# Patient Record
Sex: Female | Born: 1960
Health system: Southern US, Community
[De-identification: ages and names within clinical notes are randomized; demographics above are authoritative.]

## PROBLEM LIST (undated history)

## (undated) DIAGNOSIS — F32A Depression, unspecified: Secondary | ICD-10-CM

## (undated) DIAGNOSIS — K219 Gastro-esophageal reflux disease without esophagitis: Secondary | ICD-10-CM

## (undated) DIAGNOSIS — I499 Cardiac arrhythmia, unspecified: Secondary | ICD-10-CM

## (undated) DIAGNOSIS — F329 Major depressive disorder, single episode, unspecified: Secondary | ICD-10-CM

## (undated) DIAGNOSIS — W503XXA Accidental bite by another person, initial encounter: Secondary | ICD-10-CM

## (undated) DIAGNOSIS — E785 Hyperlipidemia, unspecified: Secondary | ICD-10-CM

## (undated) DIAGNOSIS — R42 Dizziness and giddiness: Secondary | ICD-10-CM

## (undated) DIAGNOSIS — R002 Palpitations: Secondary | ICD-10-CM

## (undated) DIAGNOSIS — D259 Leiomyoma of uterus, unspecified: Secondary | ICD-10-CM

## (undated) DIAGNOSIS — M1990 Unspecified osteoarthritis, unspecified site: Secondary | ICD-10-CM

## (undated) DIAGNOSIS — M419 Scoliosis, unspecified: Secondary | ICD-10-CM

## (undated) DIAGNOSIS — F419 Anxiety disorder, unspecified: Secondary | ICD-10-CM

## (undated) HISTORY — DX: Gastro-esophageal reflux disease without esophagitis: K21.9

## (undated) HISTORY — DX: Hyperlipidemia, unspecified: E78.5

---

## 1983-10-12 HISTORY — PX: BREAST EXCISIONAL BIOPSY: SUR124

## 1983-10-12 HISTORY — PX: BREAST BIOPSY: SHX20

## 1993-10-11 HISTORY — PX: UTERINE FIBROID SURGERY: SHX826

## 2005-01-18 ENCOUNTER — Ambulatory Visit: Payer: Self-pay | Admitting: Obstetrics and Gynecology

## 2006-02-14 ENCOUNTER — Ambulatory Visit: Payer: Self-pay | Admitting: Obstetrics and Gynecology

## 2007-02-20 ENCOUNTER — Ambulatory Visit: Payer: Self-pay | Admitting: Obstetrics and Gynecology

## 2007-03-08 ENCOUNTER — Ambulatory Visit: Payer: Self-pay | Admitting: Obstetrics and Gynecology

## 2007-07-08 ENCOUNTER — Emergency Department: Payer: Self-pay | Admitting: Emergency Medicine

## 2007-07-25 ENCOUNTER — Ambulatory Visit: Payer: Self-pay | Admitting: Gastroenterology

## 2008-02-26 ENCOUNTER — Ambulatory Visit: Payer: Self-pay | Admitting: Obstetrics and Gynecology

## 2008-10-11 HISTORY — PX: ESOPHAGOGASTRODUODENOSCOPY (EGD) WITH ESOPHAGEAL DILATION: SHX5812

## 2009-03-03 ENCOUNTER — Ambulatory Visit: Payer: Self-pay | Admitting: Obstetrics and Gynecology

## 2009-05-05 ENCOUNTER — Emergency Department: Payer: Self-pay

## 2010-03-05 ENCOUNTER — Ambulatory Visit: Payer: Self-pay | Admitting: Obstetrics and Gynecology

## 2010-08-01 ENCOUNTER — Emergency Department: Payer: Self-pay | Admitting: Internal Medicine

## 2011-03-10 ENCOUNTER — Ambulatory Visit: Payer: Self-pay | Admitting: Obstetrics and Gynecology

## 2011-03-15 ENCOUNTER — Ambulatory Visit: Payer: Self-pay | Admitting: Obstetrics and Gynecology

## 2012-03-31 ENCOUNTER — Ambulatory Visit: Payer: Self-pay | Admitting: Obstetrics and Gynecology

## 2013-04-06 ENCOUNTER — Ambulatory Visit: Payer: Self-pay | Admitting: Obstetrics and Gynecology

## 2014-02-21 DIAGNOSIS — F419 Anxiety disorder, unspecified: Secondary | ICD-10-CM | POA: Insufficient documentation

## 2014-02-21 DIAGNOSIS — K219 Gastro-esophageal reflux disease without esophagitis: Secondary | ICD-10-CM | POA: Insufficient documentation

## 2014-04-18 ENCOUNTER — Ambulatory Visit: Payer: Self-pay | Admitting: Obstetrics and Gynecology

## 2014-12-05 DIAGNOSIS — R0602 Shortness of breath: Secondary | ICD-10-CM | POA: Insufficient documentation

## 2014-12-05 DIAGNOSIS — R002 Palpitations: Secondary | ICD-10-CM | POA: Insufficient documentation

## 2014-12-10 DIAGNOSIS — E782 Mixed hyperlipidemia: Secondary | ICD-10-CM | POA: Insufficient documentation

## 2015-04-01 ENCOUNTER — Other Ambulatory Visit: Payer: Self-pay | Admitting: Obstetrics and Gynecology

## 2015-04-01 ENCOUNTER — Other Ambulatory Visit: Payer: Self-pay | Admitting: Pharmacy Technician

## 2015-04-01 DIAGNOSIS — Z1231 Encounter for screening mammogram for malignant neoplasm of breast: Secondary | ICD-10-CM

## 2015-06-23 ENCOUNTER — Ambulatory Visit
Admission: RE | Admit: 2015-06-23 | Discharge: 2015-06-23 | Disposition: A | Discharge status: Home / Self Care | Payer: 59 | Source: Ambulatory Visit | Attending: Obstetrics and Gynecology | Admitting: Obstetrics and Gynecology

## 2015-06-23 DIAGNOSIS — Z1231 Encounter for screening mammogram for malignant neoplasm of breast: Secondary | ICD-10-CM | POA: Insufficient documentation

## 2016-01-08 ENCOUNTER — Telehealth: Payer: Self-pay | Admitting: Gastroenterology

## 2016-01-08 NOTE — Telephone Encounter (Signed)
Left voice message for patient to call and schedule a appointment for reflux disease,notes under media

## 2016-02-09 ENCOUNTER — Ambulatory Visit (INDEPENDENT_AMBULATORY_CARE_PROVIDER_SITE_OTHER): Payer: 59 | Admitting: Gastroenterology

## 2016-02-09 ENCOUNTER — Encounter: Payer: Self-pay | Admitting: Gastroenterology

## 2016-02-09 ENCOUNTER — Other Ambulatory Visit: Payer: Self-pay

## 2016-02-09 VITALS — BP 114/63 | HR 77 | Temp 98.5°F | Ht 63.0 in | Wt 145.0 lb

## 2016-02-09 DIAGNOSIS — K219 Gastro-esophageal reflux disease without esophagitis: Secondary | ICD-10-CM | POA: Diagnosis not present

## 2016-02-09 MED ORDER — PANTOPRAZOLE SODIUM 40 MG PO TBEC: 40.0000 mg | DELAYED_RELEASE_TABLET | Freq: Every day | ORAL | Status: DC

## 2016-02-09 NOTE — Progress Notes (Signed)
Gastroenterology Consultation  Referring Provider:     No ref. provider found Primary Care Physician:  Pauline Good, NP Primary Gastroenterologist:  Dr. Allen Norris     Reason for Consultation:     Heartburn        HPI:   Carol Espinoza is a 55 y.o. y/o female referred for consultation & management of Heartburn by Dr. Pauline Good, NP.   This patient comes to me today with  of heartburn. The patient had seen me many years ago and was put on  Protonix at that time but reports that her insurance company did not cover it. She was switched to omeprazole. The patient was later put on Celexa. She reports that she started to have palpitations and she found out that there was a drug to drug interaction. She stopped the omeprazole and her symptoms of palpitations went away. She was put on Zantac and reports that her symptoms improved but did not go away. She denies any dysphagia. She also denies ever having a colonoscopy in the past. There is no report of rectal bleeding, nausea, vomiting, black stools or bloody stools.  Past Medical History  Diagnosis Date  . GERD (gastroesophageal reflux disease)   . Hyperlipidemia     Past Surgical History  Procedure Laterality Date  . Breast biopsy Right 1985    removed third nipple    Prior to Admission medications   Medication Sig Start Date End Date Taking? Authorizing Provider  citalopram (CELEXA) 40 MG tablet  02/03/16  Yes Historical Provider, MD  JUNEL FE 1/20 1-20 MG-MCG tablet  02/05/16  Yes Historical Provider, MD  simvastatin (ZOCOR) 40 MG tablet TAKE 1 TABLET (40 MG TOTAL) BY MOUTH NIGHTLY. 01/08/16  Yes Historical Provider, MD  omeprazole (PRILOSEC) 20 MG capsule Reported on 02/09/2016 11/10/15   Historical Provider, MD  pantoprazole (PROTONIX) 40 MG tablet Take 1 tablet (40 mg total) by mouth daily. 02/09/16   Lucilla Lame, MD  ranitidine (ZANTAC) 150 MG capsule Reported on 02/09/2016 02/07/16   Historical Provider, MD    History reviewed. No pertinent  family history.   Social History  Substance Use Topics  . Smoking status: Never Smoker   . Smokeless tobacco: Never Used  . Alcohol Use: No    Allergies as of 02/09/2016 - Review Complete 02/09/2016  Allergen Reaction Noted  . Sulfamethoxazole-trimethoprim Rash 02/09/2016    Review of Systems:    All systems reviewed and negative except where noted in HPI.   Physical Exam:  BP 114/63 mmHg  Pulse 77  Temp(Src) 98.5 F (36.9 C) (Oral)  Ht 5\' 3"  (1.6 m)  Wt 145 lb (65.772 kg)  BMI 25.69 kg/m2 No LMP recorded. Psych:  Alert and cooperative. Normal mood and affect. General:   Alert,  Well-developed, well-nourished, pleasant and cooperative in NAD Head:  Normocephalic and atraumatic. Eyes:  Sclera clear, no icterus.   Conjunctiva pink. Ears:  Normal auditory acuity. Nose:  No deformity, discharge, or lesions. Mouth:  No deformity or lesions,oropharynx pink & moist. Neck:  Supple; no masses or thyromegaly. Lungs:  Respirations even and unlabored.  Clear throughout to auscultation.   No wheezes, crackles, or rhonchi. No acute distress. Heart:  Regular rate and rhythm; no murmurs, clicks, rubs, or gallops. Abdomen:  Normal bowel sounds.  No bruits.  Soft, non-tender and non-distended without masses, hepatosplenomegaly or hernias noted.  No guarding or rebound tenderness.  Negative Carnett sign.   Rectal:  Deferred.  Msk:  Symmetrical  without gross deformities.  Good, equal movement & strength bilaterally. Pulses:  Normal pulses noted. Extremities:  No clubbing or edema.  No cyanosis. Neurologic:  Alert and oriented x3;  grossly normal neurologically. Skin:  Intact without significant lesions or rashes.  No jaundice. Lymph Nodes:  No significant cervical adenopathy. Psych:  Alert and cooperative. Normal mood and affect.  Imaging Studies: No results found.  Assessment and Plan:   Carol Espinoza is a 55 y.o. y/o female  who has a history of heartburn and has needed a dilation  of her esophagus in the past. She was well-controlled on omeprazole but had to stop it because of a drug to drug interaction. She will be started back on pantoprazole. The patient has also been encouraged to have a colonoscopy. The patient states she will consider it.   Note: This dictation was prepared with Dragon dictation along with smaller phrase technology. Any transcriptional errors that result from this process are unintentional.

## 2016-04-06 ENCOUNTER — Other Ambulatory Visit: Payer: Self-pay | Admitting: Obstetrics and Gynecology

## 2016-04-06 DIAGNOSIS — Z1231 Encounter for screening mammogram for malignant neoplasm of breast: Secondary | ICD-10-CM

## 2016-06-16 ENCOUNTER — Other Ambulatory Visit: Payer: Self-pay

## 2016-06-16 DIAGNOSIS — K2100 Gastro-esophageal reflux disease with esophagitis, without bleeding: Secondary | ICD-10-CM

## 2016-06-16 MED ORDER — PANTOPRAZOLE SODIUM 40 MG PO TBEC: 40.0000 mg | DELAYED_RELEASE_TABLET | Freq: Every day | ORAL | 3 refills | Status: DC

## 2016-06-24 ENCOUNTER — Ambulatory Visit
Admission: RE | Admit: 2016-06-24 | Discharge: 2016-06-24 | Disposition: A | Discharge status: Home / Self Care | Payer: 59 | Source: Ambulatory Visit | Attending: Obstetrics and Gynecology | Admitting: Obstetrics and Gynecology

## 2016-06-24 ENCOUNTER — Other Ambulatory Visit: Payer: Self-pay | Admitting: Obstetrics and Gynecology

## 2016-06-24 DIAGNOSIS — Z1231 Encounter for screening mammogram for malignant neoplasm of breast: Secondary | ICD-10-CM | POA: Diagnosis not present

## 2016-09-09 ENCOUNTER — Telehealth: Payer: Self-pay | Admitting: Gastroenterology

## 2016-09-09 NOTE — Telephone Encounter (Signed)
colonoscopy

## 2016-09-20 NOTE — Telephone Encounter (Signed)
LVM for pt to return my call to schedule a colonoscopy on 09/17/16 @ 2:38pm.

## 2016-09-22 ENCOUNTER — Other Ambulatory Visit: Payer: Self-pay

## 2016-09-22 ENCOUNTER — Telehealth: Payer: Self-pay

## 2016-09-22 NOTE — Telephone Encounter (Signed)
Wants to be scheduled for a colonoscopy

## 2016-09-23 ENCOUNTER — Other Ambulatory Visit: Payer: Self-pay

## 2016-09-23 NOTE — Telephone Encounter (Signed)
Pt scheduled for a screening colonoscopy at [facility] on 10/22/16 with Wohl. Please precert.

## 2016-09-24 NOTE — Telephone Encounter (Signed)
Patient has Garment/textile technologist. Pre cert is not required

## 2016-10-14 ENCOUNTER — Encounter: Payer: Self-pay | Admitting: *Deleted

## 2016-10-21 NOTE — Discharge Instructions (Signed)

## 2016-10-22 ENCOUNTER — Ambulatory Visit
Admission: RE | Admit: 2016-10-22 | Discharge: 2016-10-22 | Disposition: A | Discharge status: Home / Self Care | Payer: Managed Care, Other (non HMO) | Source: Ambulatory Visit | Attending: Gastroenterology | Admitting: Gastroenterology

## 2016-10-22 ENCOUNTER — Encounter
Admission: RE | Disposition: A | Discharge status: Home / Self Care | Payer: Self-pay | Source: Ambulatory Visit | Attending: Gastroenterology

## 2016-10-22 ENCOUNTER — Ambulatory Visit: Payer: Managed Care, Other (non HMO) | Admitting: Anesthesiology

## 2016-10-22 DIAGNOSIS — E785 Hyperlipidemia, unspecified: Secondary | ICD-10-CM | POA: Diagnosis not present

## 2016-10-22 DIAGNOSIS — K640 First degree hemorrhoids: Secondary | ICD-10-CM | POA: Diagnosis not present

## 2016-10-22 DIAGNOSIS — Z79899 Other long term (current) drug therapy: Secondary | ICD-10-CM | POA: Insufficient documentation

## 2016-10-22 DIAGNOSIS — M419 Scoliosis, unspecified: Secondary | ICD-10-CM | POA: Insufficient documentation

## 2016-10-22 DIAGNOSIS — Z1211 Encounter for screening for malignant neoplasm of colon: Secondary | ICD-10-CM | POA: Diagnosis not present

## 2016-10-22 DIAGNOSIS — K219 Gastro-esophageal reflux disease without esophagitis: Secondary | ICD-10-CM | POA: Diagnosis not present

## 2016-10-22 HISTORY — PX: COLONOSCOPY WITH PROPOFOL: SHX5780

## 2016-10-22 HISTORY — DX: Palpitations: R00.2

## 2016-10-22 HISTORY — DX: Scoliosis, unspecified: M41.9

## 2016-10-22 HISTORY — PX: POLYPECTOMY: SHX5525

## 2016-10-22 HISTORY — DX: Dizziness and giddiness: R42

## 2016-10-22 SURGERY — COLONOSCOPY WITH PROPOFOL
Anesthesia: Monitor Anesthesia Care | Wound class: Contaminated

## 2016-10-22 MED ORDER — LACTATED RINGERS IV SOLN
INTRAVENOUS | Status: DC
  Administered 2016-10-22: 08:00:00 via INTRAVENOUS

## 2016-10-22 MED ORDER — OXYCODONE HCL 5 MG PO TABS: 5.0000 mg | ORAL_TABLET | Freq: Once | ORAL | Status: DC | PRN

## 2016-10-22 MED ORDER — LIDOCAINE HCL (CARDIAC) 20 MG/ML IV SOLN
INTRAVENOUS | Status: DC | PRN
  Administered 2016-10-22: 40 mg via INTRAVENOUS

## 2016-10-22 MED ORDER — LACTATED RINGERS IV SOLN: INTRAVENOUS | Status: DC

## 2016-10-22 MED ORDER — PROPOFOL 10 MG/ML IV BOLUS
INTRAVENOUS | Status: DC | PRN
  Administered 2016-10-22 (×3): 50 mg via INTRAVENOUS
  Administered 2016-10-22: 100 mg via INTRAVENOUS
  Administered 2016-10-22 (×2): 50 mg via INTRAVENOUS

## 2016-10-22 MED ORDER — STERILE WATER FOR IRRIGATION IR SOLN
Status: DC | PRN
  Administered 2016-10-22: 09:00:00

## 2016-10-22 MED ORDER — OXYCODONE HCL 5 MG/5ML PO SOLN: 5.0000 mg | Freq: Once | ORAL | Status: DC | PRN

## 2016-10-22 SURGICAL SUPPLY — 23 items

## 2016-10-22 NOTE — Anesthesia Postprocedure Evaluation (Signed)
Anesthesia Post Note  Patient: Carol Espinoza  Procedure(s) Performed: Procedure(s) (LRB): COLONOSCOPY WITH PROPOFOL (N/A) POLYPECTOMY  Patient location during evaluation: PACU Anesthesia Type: MAC Level of consciousness: awake Pain management: pain level controlled Vital Signs Assessment: post-procedure vital signs reviewed and stable Respiratory status: spontaneous breathing Cardiovascular status: blood pressure returned to baseline Postop Assessment: no headache Anesthetic complications: no    Jaci Standard, III,  Nkenge Sonntag D

## 2016-10-22 NOTE — Anesthesia Procedure Notes (Signed)
Procedure Name: MAC Date/Time: 10/22/2016 9:18 AM Performed by: Janna Arch Pre-anesthesia Checklist: Patient identified, Emergency Drugs available, Suction available and Patient being monitored Patient Re-evaluated:Patient Re-evaluated prior to inductionOxygen Delivery Method: Nasal cannula

## 2016-10-22 NOTE — Op Note (Addendum)
[facility] Patient Name: Carol Espinoza Procedure Date: 10/22/2016 9:12 AM MRN: [id number] Account #: [id number] Date of Birth: December 14, 1960 Admit Type: Outpatient Age: 56 Room: [facility] OR ROOM 01 Gender: Female Note Status: Finalized Procedure:            Colonoscopy Indications:          Screening for colorectal malignant neoplasm Providers:            Lucilla Lame MD, MD Referring MD:         Markus Jarvis, MD (Referring MD) Medicines:            Propofol per Anesthesia Complications:        No immediate complications. Procedure:            Pre-Anesthesia Assessment:                       - Prior to the procedure, a History and Physical was                        performed, and patient medications and allergies were                        reviewed. The patient's tolerance of previous                        anesthesia was also reviewed. The risks and benefits of                        the procedure and the sedation options and risks were                        discussed with the patient. All questions were                        answered, and informed consent was obtained. Prior                        Anticoagulants: The patient has taken no previous                        anticoagulant or antiplatelet agents. ASA Grade                        Assessment: II - A patient with mild systemic disease.                        After reviewing the risks and benefits, the patient was                        deemed in satisfactory condition to undergo the                        procedure.                       After obtaining informed consent, the colonoscope was                        passed under direct vision. Throughout the procedure,  the patient's blood pressure, pulse, and oxygen                        saturations were monitored continuously. The Olympus CF                        H180AL colonoscope (S#: [id number]) was introduced  through                        the anus and advanced to the the cecum, identified by                        appendiceal orifice and ileocecal valve. The                        colonoscopy was performed without difficulty. The                        patient tolerated the procedure well. The quality of                        the bowel preparation was excellent. Findings:      The perianal and digital rectal examinations were normal.      A 4 mm polyp was found in the rectum. The polyp was sessile. The polyp       was removed with a cold snare. Resection and retrieval were complete.      Non-bleeding internal hemorrhoids were found during retroflexion. The       hemorrhoids were Grade I (internal hemorrhoids that do not prolapse). Impression:           - One 4 mm polyp in the rectum, removed with a cold                        snare. Resected and retrieved.                       - Non-bleeding internal hemorrhoids. Recommendation:       - Discharge patient to home.                       - Resume previous diet.                       - Continue present medications.                       - Await pathology results.                       - Repeat colonoscopy in 5 years if polyp adenoma and 10                        years if hyperplastic Procedure Code(s):    --- Professional ---                       [phone], Colonoscopy, flexible; with removal of tumor(s),                        polyp(s), or other lesion(s) by snare technique Diagnosis Code(s):    --- Professional ---  Z12.11, Encounter for screening for malignant neoplasm                        of colon                       K62.1, Rectal polyp CPT copyright 2016 American Medical Association. All rights reserved. The codes documented in this report are preliminary and upon coder review may  be revised to meet current compliance requirements. Lucilla Lame MD, MD 10/22/2016 9:36:19 AM This report has been signed  electronically. Number of Addenda: 0 Note Initiated On: 10/22/2016 9:12 AM Scope Withdrawal Time: 0 hours 7 minutes 7 seconds  Total Procedure Duration: 0 hours 10 minutes 43 seconds       [facility]

## 2016-10-22 NOTE — Transfer of Care (Signed)
Immediate Anesthesia Transfer of Care Note  Patient: Carol Espinoza  Procedure(s) Performed: Procedure(s): COLONOSCOPY WITH PROPOFOL (N/A) POLYPECTOMY  Patient Location: PACU  Anesthesia Type: MAC  Level of Consciousness: awake, alert  and patient cooperative  Airway and Oxygen Therapy: Patient Spontanous Breathing and Patient connected to supplemental oxygen  Post-op Assessment: Post-op Vital signs reviewed, Patient's Cardiovascular Status Stable, Respiratory Function Stable, Patent Airway and No signs of Nausea or vomiting  Post-op Vital Signs: Reviewed and stable  Complications: No apparent anesthesia complications

## 2016-10-22 NOTE — H&P (Signed)
  Carol Lame, MD [facility] [address] [place], [place] [address] Phone: [phone] Fax : [phone]  Primary Care Physician:  Pauline Good, NP Primary Gastroenterologist:  Dr. Allen Norris  Pre-Procedure History & Physical: HPI:  Carol Espinoza is a 56 y.o. female is here for a screening colonoscopy.   Past Medical History:  Diagnosis Date  . GERD (gastroesophageal reflux disease)   . Hyperlipidemia   . Palpitations   . Scoliosis   . Vertigo    positional. monthly    Past Surgical History:  Procedure Laterality Date  . BREAST BIOPSY Right 1985   removed third nipple  . CESAREAN SECTION  1996  . ESOPHAGOGASTRODUODENOSCOPY (EGD) WITH ESOPHAGEAL DILATION  2010  . [place]    Prior to Admission medications   Medication Sig Start Date End Date Taking? Authorizing Provider  citalopram (CELEXA) 40 MG tablet  02/03/16  Yes Historical Provider, MD  JUNEL FE 1/20 1-20 MG-MCG tablet  02/05/16  Yes Historical Provider, MD  Multiple Vitamin (MULTIVITAMIN) tablet Take 1 tablet by mouth daily.   Yes Historical Provider, MD  pantoprazole (PROTONIX) 40 MG tablet Take 1 tablet (40 mg total) by mouth daily. 06/16/16  Yes Bradrick Kamau Allen Norris, MD  simvastatin (ZOCOR) 40 MG tablet TAKE 1 TABLET (40 MG TOTAL) BY MOUTH NIGHTLY. 01/08/16  Yes Historical Provider, MD    Allergies as of 09/23/2016 - Review Complete 02/09/2016  Allergen Reaction Noted  . Sulfamethoxazole-trimethoprim Rash 02/09/2016    History reviewed. No pertinent family history.  Social History   Social History  . Marital status: Married    Spouse name: N/A  . Number of children: N/A  . Years of education: N/A   Occupational History  . Not on file.   Social History Main Topics  . Smoking status: Never Smoker  . Smokeless tobacco: Never Used  . Alcohol use No  . Drug use: No  . Sexual activity: Not on file   Other Topics Concern  . Not on file   Social History Narrative  . No narrative on file     Review of Systems: See HPI, otherwise negative ROS  Physical Exam: BP 108/70   Pulse (!) 117   Temp 97.5 F (36.4 C) (Tympanic)   Resp 16   Ht 5\' 3"  (1.6 m)   Wt 151 lb (68.5 kg)   LMP 09/30/2016 (Approximate) Comment: preg test negative  SpO2 99%   BMI 26.75 kg/m  General:   Alert,  pleasant and cooperative in NAD Head:  Normocephalic and atraumatic. Neck:  Supple; no masses or thyromegaly. Lungs:  Clear throughout to auscultation.    Heart:  Regular rate and rhythm. Abdomen:  Soft, nontender and nondistended. Normal bowel sounds, without guarding, and without rebound.   Neurologic:  Alert and  oriented x4;  grossly normal neurologically.  Impression/Plan: [name] is now here to undergo a screening colonoscopy.  Risks, benefits, and alternatives regarding colonoscopy have been reviewed with the patient.  Questions have been answered.  All parties agreeable.

## 2016-10-22 NOTE — Anesthesia Preprocedure Evaluation (Signed)
Anesthesia Evaluation  Patient identified by MRN, date of birth, ID band Patient awake    Reviewed: Allergy & Precautions, H&P , NPO status   Airway Mallampati: II  TM Distance: >3 FB Neck ROM: full    Dental no notable dental hx.    Pulmonary neg pulmonary ROS,    Pulmonary exam normal        Cardiovascular negative cardio ROS Normal cardiovascular exam     Neuro/Psych    GI/Hepatic Neg liver ROS,   Endo/Other  negative endocrine ROS  Renal/GU negative Renal ROS     Musculoskeletal   Abdominal   Peds  Hematology negative hematology ROS (+)   Anesthesia Other Findings   Reproductive/Obstetrics                             Anesthesia Physical Anesthesia Plan  ASA: II  Anesthesia Plan: MAC   Post-op Pain Management:    Induction:   Airway Management Planned:   Additional Equipment:   Intra-op Plan:   Post-operative Plan:   Informed Consent:   Plan Discussed with:   Anesthesia Plan Comments:         Anesthesia Quick Evaluation

## 2016-10-25 ENCOUNTER — Encounter: Payer: Self-pay | Admitting: Gastroenterology

## 2017-02-18 ENCOUNTER — Encounter: Payer: Self-pay | Admitting: *Deleted

## 2017-02-18 ENCOUNTER — Ambulatory Visit
Admission: EM | Admit: 2017-02-18 | Discharge: 2017-02-18 | Disposition: A | Discharge status: Home / Self Care | Payer: Managed Care, Other (non HMO) | Attending: Family Medicine | Admitting: Family Medicine

## 2017-02-18 DIAGNOSIS — H6123 Impacted cerumen, bilateral: Secondary | ICD-10-CM

## 2017-02-18 DIAGNOSIS — H9202 Otalgia, left ear: Secondary | ICD-10-CM | POA: Diagnosis not present

## 2017-02-18 NOTE — ED Triage Notes (Signed)
Patient started having dizziness, ear pain, and nasal congestion 1 week ago. Patient also reports TMJ symptoms.

## 2017-02-18 NOTE — ED Provider Notes (Signed)
[facility] ____________________________________________  Time seen: Approximately 1150 am  I have reviewed the triage vital signs and the nursing notes.   HISTORY  Chief Complaint Otalgia  HPI Carol Espinoza is a 56 y.o. female  presenting for evaluation of ear discomfort, runny nose and TMJ discomfort has been present for the last week. Patient reports that she does have a history and TMJ discomfort which is previously followed with Ear nose and throat. States that she has an Ear, Nose and throat appointment in 2 weeks regarding the same. Patient reports that she talks a lot, clenches her teeth and chews a lot of chewy foods which often aggravates the area. Patient reports that she's been having some discomfort to her left TMJ joint for the last week. REports some grinding at times to TMJ joints. Also reports some runny nose, states possible seasonal allergies, but denies any sinus pain or sinus pressure. Denies any thick nasal drainage. Denies sore throat. Denies fevers. Patient does also report that last week she had a few episodes of lightheadedness that was present only first thing in the morning when she got up and then quickly resolved when she got going. Patient states that she directly correlates this as she had felt some fluid in her ears at that time. Denies any lightheadedness currently. Denies any syncopal or near syncopal events. Denies any other accompanying factors. Reports otherwise feels well.  Patient does state that her left ear feels like it is clogged with mild discomfort. Denies vision changes. Denies chest pain, shortness of breath, abdominal pain, dysuria, extremity pain, extremity swelling or rash. Denies recent sickness. Denies recent antibiotic use.    Past Medical History:  Diagnosis Date  . GERD (gastroesophageal reflux disease)   . Hyperlipidemia   . Palpitations   . Scoliosis   . Vertigo    positional. monthly    Patient Active Problem List     Diagnosis Date Noted  . Special screening for malignant neoplasms, colon   . Mixed hyperlipidemia 12/10/2014  . Palpitations 12/05/2014  . Shortness of breath 12/05/2014  . Anxiety 02/21/2014  . GERD (gastroesophageal reflux disease) 02/21/2014    Past Surgical History:  Procedure Laterality Date  . BREAST BIOPSY Right 1985   removed third nipple  . CESAREAN SECTION  1996  . COLONOSCOPY WITH PROPOFOL N/A 10/22/2016   Procedure: COLONOSCOPY WITH PROPOFOL;  Surgeon: Lucilla Lame, MD;  Location: [facility];  Service: Endoscopy;  Laterality: N/A;  . ESOPHAGOGASTRODUODENOSCOPY (EGD) WITH ESOPHAGEAL DILATION  2010  . POLYPECTOMY  10/22/2016   Procedure: POLYPECTOMY;  Surgeon: Lucilla Lame, MD;  Location: [facility];  Service: Endoscopy;;  . UTERINE FIBROID SURGERY  1995     No current facility-administered medications for this encounter.   Current Outpatient Prescriptions:  .  citalopram (CELEXA) 40 MG tablet, , Disp: , Rfl:  .  JUNEL FE 1/20 1-20 MG-MCG tablet, , Disp: , Rfl:  .  Multiple Vitamin (MULTIVITAMIN) tablet, Take 1 tablet by mouth daily., Disp: , Rfl:  .  pantoprazole (PROTONIX) 40 MG tablet, Take 1 tablet (40 mg total) by mouth daily., Disp: 90 tablet, Rfl: 3 .  simvastatin (ZOCOR) 40 MG tablet, TAKE 1 TABLET (40 MG TOTAL) BY MOUTH NIGHTLY., Disp: , Rfl: 4  Allergies Amoxicillin; Other; and Sulfamethoxazole-trimethoprim  History reviewed. No pertinent family history.  Social History Social History  Substance Use Topics  . Smoking status: Never Smoker  . Smokeless tobacco: Never Used  . Alcohol use No  Review of Systems Constitutional: No fever/chills Eyes: No visual changes. ENT: No sore throat. As above. Cardiovascular: Denies chest pain. Respiratory: Denies shortness of breath. Gastrointestinal: No abdominal pain.   Genitourinary: Negative for dysuria. Musculoskeletal: Negative for back pain. Skin: Negative for  rash.   ____________________________________________   PHYSICAL EXAM:  VITAL SIGNS: ED Triage Vitals  Enc Vitals Group     BP 02/18/17 1144 (!) 146/62     Pulse Rate 02/18/17 1144 (!) 107 Recheck 88     Resp 02/18/17 1144 18     Temp 02/18/17 1144 98.2 F (36.8 C)     Temp Source 02/18/17 1144 Oral     SpO2 02/18/17 1144 100 %     Weight 02/18/17 1145 150 lb (68 kg)     Height 02/18/17 1145 5\' 2"  (1.575 m)     Head Circumference --      Peak Flow --      Pain Score 02/18/17 1145 0     Pain Loc --      Pain Edu? --      Excl. in [place]? --     Constitutional: Alert and oriented. Well appearing and in no acute distress. Head: Atraumatic. No sinus tenderness to palpation. No swelling. No erythema. Mild tenderness to bilateral, left greater than right TMJ joint.  Ears: Bilateral cerumen impaction. Bilateral ears nontender. After irrigation and curette used for cerumen removal, able to visualize partial left TM and does appear erythematous but unable to visualize fully, no erythema noted to right TM. No surrounding tenderness, swelling or erythema.  Nose:Nasal congestion with clear rhinorrhea  Mouth/Throat: Mucous membranes are moist. No pharyngeal erythema. No tonsillar swelling or exudate.  Hematological/Lymphatic/Immunilogical: No cervical lymphadenopathy. Cardiovascular: Normal rate, regular rhythm. Grossly normal heart sounds.  Good peripheral circulation. Respiratory: Normal respiratory effort.  No retractions. No wheezes, rales or rhonchi. Good air movement.  Musculoskeletal: Ambulatory with steady gait.  Neurologic:  Normal speech and language. No gait instability. Skin:  Skin appears warm, dry  Psychiatric: Mood and affect are normal. Speech and behavior are normal.  ___________________________________________   LABS (all labs ordered are listed, but only abnormal results are displayed)  Labs Reviewed - No data to  display ____________________________________________  RADIOLOGY  No results found. ____________________________________________   PROCEDURES Procedures   Performed by RN and myself. Patient gave verbal consent. Ceruminosis impaction noted bilaterally.  Wax is removed by irrigation and curette. Instructions for home care to prevent wax buildup are given.  INITIAL IMPRESSION / ASSESSMENT AND PLAN / ED COURSE  Pertinent labs & imaging results that were available during my care of the patient were reviewed by me and considered in my medical decision making (see chart for details).  Well-appearing patient. No acute distress. Bilateral cerumen impaction. Post irrigation and in no removal with curette, cerumen remains. Was able to visualize partial of the left TM appears erythematous but unable to fully visualize complete TM and counseled regarding this. Discussed with patient treating with oral amoxicillin as well as antihistamines, patient states that she does not want to take any antibiotics as she does not tolerate antibiotics well. Patient states that she would prefer to see your nose and throat prior to starting antibiotics. Counseled regarding calling and rescheduling her ear nose and throat appointment for sooner. Patient agrees to this. Patient also agrees to seek reevaluation for any change or increase discomfort. Discussed NSAID use and supportive care for TMJ tenderness. Patient verbalized understanding and agree to this plan.  Discussed follow up with Primary care physician this week. Discussed follow up and return parameters including no resolution or any worsening concerns. Patient verbalized understanding and agreed to plan.   ____________________________________________   FINAL CLINICAL IMPRESSION(S) / ED DIAGNOSES  Final diagnoses:  Discomfort of left ear  Bilateral impacted cerumen     Discharge Medication List as of 02/18/2017 12:30 PM      Note: This dictation was  prepared with Dragon dictation along with smaller phrase technology. Any transcriptional errors that result from this process are unintentional.         Marylene Land, NP 02/18/17 1301

## 2017-02-18 NOTE — Discharge Instructions (Signed)
Follow up closely with Ear Nose and throat.  Follow up with your primary care physician this week as needed. Return to Urgent care for new or worsening concerns.

## 2017-02-24 ENCOUNTER — Ambulatory Visit
Admission: EM | Admit: 2017-02-24 | Discharge: 2017-02-24 | Disposition: A | Discharge status: Home / Self Care | Payer: Managed Care, Other (non HMO) | Attending: Family Medicine | Admitting: Family Medicine

## 2017-02-24 DIAGNOSIS — H6122 Impacted cerumen, left ear: Secondary | ICD-10-CM

## 2017-02-24 DIAGNOSIS — H9202 Otalgia, left ear: Secondary | ICD-10-CM

## 2017-02-24 DIAGNOSIS — H6692 Otitis media, unspecified, left ear: Secondary | ICD-10-CM | POA: Diagnosis not present

## 2017-02-24 MED ORDER — CIPROFLOXACIN-HYDROCORTISONE 0.2-1 % OT SUSP: 3.0000 [drp] | Freq: Two times a day (BID) | OTIC | 0 refills | Status: DC

## 2017-02-24 MED ORDER — CEFDINIR 300 MG PO CAPS: 300.0000 mg | ORAL_CAPSULE | Freq: Two times a day (BID) | ORAL | 0 refills | Status: DC

## 2017-02-24 NOTE — Discharge Instructions (Signed)
-  Cipro antibiotic ear drops to L ear twice a day for 7 days -cefdinir twice daily for 7 days -can use OTC Tylenol and ibuprofen for pain -can use OTC probiotic to help with abdominal symptoms that occur with antibiotic use -if no improvement in symptoms in next 2-3 days, contact ENT office again by phone and contact ENT directly through [facility] for further directions and care. -can return to this clinic or PCP as needed if no improvement

## 2017-02-24 NOTE — ED Provider Notes (Signed)
[facility] CSN: [id number]     Arrival date & time 02/24/17  1115 History   First MD Initiated Contact with Patient 02/24/17 1205     Chief Complaint  Patient presents with  . Cerumen Impaction   (Consider location/radiation/quality/duration/timing/severity/associated sxs/prior Treatment) Patient is a 56 year old female who presents for recheck of her left ear cerumen impaction. Patient was seen here on May 11 for ear discomfort. Patient was noted to have a cerumen impaction to the left ear. With irrigation and attempted cerumen removal with curette, only a small part of the TM was able to be visualized and thought to be erythematous. . Patient denied antibiotic for that time and stated she wanted to see her ENT before she takes antibiotic. Patient returns today for discomfort and states she was unable to move her ENT appointment.      Past Medical History:  Diagnosis Date  . GERD (gastroesophageal reflux disease)   . Hyperlipidemia   . Palpitations   . Scoliosis   . Vertigo    positional. monthly   Past Surgical History:  Procedure Laterality Date  . BREAST BIOPSY Right 1985   removed third nipple  . CESAREAN SECTION  1996  . COLONOSCOPY WITH PROPOFOL N/A 10/22/2016   Procedure: COLONOSCOPY WITH PROPOFOL;  Surgeon: Lucilla Lame, MD;  Location: [place];  Service: Endoscopy;  Laterality: N/A;  . ESOPHAGOGASTRODUODENOSCOPY (EGD) WITH ESOPHAGEAL DILATION  2010  . POLYPECTOMY  10/22/2016   Procedure: POLYPECTOMY;  Surgeon: Lucilla Lame, MD;  Location: [place];  Service: Endoscopy;;  . [place]   History reviewed. No pertinent family history. Social History  Substance Use Topics  . Smoking status: Never Smoker  . Smokeless tobacco: Never Used  . Alcohol use No   OB History    No data available     Review of Systems  Constitutional: Negative for chills and fever.  HENT: Positive for dental problem (TMJ,  recurrent issue) and ear pain. Negative for sinus pain and sinus pressure.        Ear fullness  Respiratory: Negative.  Negative for shortness of breath.   Genitourinary: Negative.   Musculoskeletal: Negative.   Neurological: Positive for dizziness. Negative for tremors, syncope and headaches.  All other systems reviewed and are negative.   Allergies  Amoxicillin; Other; and Sulfamethoxazole-trimethoprim  Home Medications   Prior to Admission medications   Medication Sig Start Date End Date Taking? Authorizing Provider  cefdinir (OMNICEF) 300 MG capsule Take 1 capsule (300 mg total) by mouth 2 (two) times daily. 02/24/17   Luvenia Redden, PA-C  ciprofloxacin-hydrocortisone (CIPRO [facility]) otic suspension Place 3 drops into the left ear 2 (two) times daily. 02/24/17   Luvenia Redden, PA-C  citalopram (CELEXA) 40 MG tablet  02/03/16   [provider]  JUNEL FE 1/20 1-20 MG-MCG tablet  02/05/16   [provider]  Multiple Vitamin (MULTIVITAMIN) tablet Take 1 tablet by mouth daily.    [provider]  pantoprazole (PROTONIX) 40 MG tablet Take 1 tablet (40 mg total) by mouth daily. 06/16/16   Lucilla Lame, MD  simvastatin (ZOCOR) 40 MG tablet TAKE 1 TABLET (40 MG TOTAL) BY MOUTH NIGHTLY. 01/08/16   [provider]   Meds Ordered and Administered this Visit  Medications - No data to display  BP 93/76 (BP Location: Left Arm)   Pulse 87   Temp 98 F (36.7 C) (Oral)   Resp 18  Ht 5\' 2"  (1.575 m)   Wt 150 lb (68 kg)   LMP 02/16/2017   SpO2 100%   BMI 27.44 kg/m  No data found.   Physical Exam  Constitutional: She is oriented to person, place, and time. She appears well-developed and well-nourished.  HENT:  Head: Normocephalic and atraumatic.  Right Ear: External ear normal.  Left ear canal S-shaped with the curved towards the face. Canal with erythema. Left TM not visualized due to cerumen located at a point past where the canal curves.  Eyes: EOM  are normal. Pupils are equal, round, and reactive to light.  Neck: Normal range of motion.  Cardiovascular: Normal rate, regular rhythm and normal heart sounds.   Pulmonary/Chest: Effort normal and breath sounds normal.  Abdominal: Soft.  Musculoskeletal: Normal range of motion.  Lymphadenopathy:    She has no cervical adenopathy.  Neurological: She is alert and oriented to person, place, and time.  Skin: Skin is warm and dry.    Urgent Care Course     Procedures: Ear irrigation 2 by nurse without removal of any cerumen.  Labs Review Labs Reviewed - No data to display  Imaging Review No results found.     MDM   1. Left ear pain   2. Impacted cerumen of left ear   3. Acute ear infection, left    Discharge Medication List as of 02/24/2017 12:23 PM    START taking these medications   Details  cefdinir (OMNICEF) 300 MG capsule Take 1 capsule (300 mg total) by mouth 2 (two) times daily., Starting Thu 02/24/2017, Normal    ciprofloxacin-hydrocortisone (CIPRO [facility]) otic suspension Place 3 drops into the left ear 2 (two) times daily., Starting Thu 02/24/2017, Normal        Patient presents for follow-up for left ear impaction, was seen in this clinic on May 11. Patient continues to have pain in the left ear as well as occasional dizziness with sitting up or lying down. Patient states she was unable to move her ENT appointment up sooner. Attempted irrigation by a nurse 2 without removal of the cerumen. Patient left canal is erythematous and does have an S-shaped to it with cerumen noted towards the TM, which was not visible. They'll fill the manipulation needed on the ear at the same time of using the scope and the curette was provided for CERUMEN removal. Patient to get a prescription for Cipro otic drops as well as oral cefdinir. Patient reports abdominal issues with previous use of amoxicillin, advised she could try oral probiotics available over-the-counter. Also recommended  patient should she not have any Protonix to 3 days to contact her ENT by phone and through [place] to help with getting worked consider further directions by the ENT. Patient verbalized understanding these agreement with plan.  Luvenia Redden, PA-C      Luvenia Redden, PA-C 02/24/17 1341

## 2017-02-24 NOTE — ED Triage Notes (Signed)
Pt is here to have her left ear re-exammed for cerumen impaction.

## 2017-05-17 ENCOUNTER — Other Ambulatory Visit: Payer: Self-pay | Admitting: Obstetrics and Gynecology

## 2017-05-17 DIAGNOSIS — Z1231 Encounter for screening mammogram for malignant neoplasm of breast: Secondary | ICD-10-CM

## 2017-05-25 ENCOUNTER — Encounter: Payer: Self-pay | Admitting: Emergency Medicine

## 2017-05-25 ENCOUNTER — Ambulatory Visit
Admission: EM | Admit: 2017-05-25 | Discharge: 2017-05-25 | Disposition: A | Discharge status: Home / Self Care | Payer: Managed Care, Other (non HMO) | Attending: Family Medicine | Admitting: Family Medicine

## 2017-05-25 DIAGNOSIS — L0390 Cellulitis, unspecified: Secondary | ICD-10-CM | POA: Diagnosis not present

## 2017-05-25 DIAGNOSIS — Z23 Encounter for immunization: Secondary | ICD-10-CM | POA: Diagnosis not present

## 2017-05-25 DIAGNOSIS — S30861A Insect bite (nonvenomous) of abdominal wall, initial encounter: Secondary | ICD-10-CM

## 2017-05-25 DIAGNOSIS — W57XXXA Bitten or stung by nonvenomous insect and other nonvenomous arthropods, initial encounter: Secondary | ICD-10-CM | POA: Diagnosis not present

## 2017-05-25 DIAGNOSIS — L03311 Cellulitis of abdominal wall: Secondary | ICD-10-CM

## 2017-05-25 MED ORDER — DOXYCYCLINE HYCLATE 100 MG PO CAPS: 100.0000 mg | ORAL_CAPSULE | Freq: Two times a day (BID) | ORAL | 0 refills | Status: DC

## 2017-05-25 MED ORDER — TETANUS-DIPHTH-ACELL PERTUSSIS 5-2.5-18.5 LF-MCG/0.5 IM SUSP
0.5000 mL | Freq: Once | INTRAMUSCULAR | Status: AC
  Administered 2017-05-25: 0.5 mL via INTRAMUSCULAR

## 2017-05-25 MED ORDER — MUPIROCIN 2 % EX OINT: TOPICAL_OINTMENT | CUTANEOUS | 0 refills | Status: DC

## 2017-05-25 NOTE — Discharge Instructions (Signed)
Take medication as prescribed. Drink plenty of fluids. Avoid scratching.   Follow up with your primary care physician this week as needed. Return to Urgent care for new or worsening concerns.

## 2017-05-25 NOTE — ED Triage Notes (Signed)
Patient c/o itchy red bump on her right lower abdomen for a week.

## 2017-05-25 NOTE — ED Provider Notes (Signed)
[facility] ____________________________________________  Time seen: Approximately 3:58 PM  I have reviewed the triage vital signs and the nursing notes.   HISTORY  Chief Complaint Insect Bite   HPI Carol Espinoza is a 56 y.o. female presenting for evaluation of red itchy suspected bug bite to right lower abdomen has been present for 1 week. States the area has been itchy the entire time and states redness has slightly worsened rather than improved. States has been applying topical Benadryl and hydrocortisone cream without change. Denies any associated pain, headaches, fevers, other rash or skin changes or other cramping symptoms. Reports otherwise feels well. Denies ever seeing insect. Denies known tick bite or tick attachments. Reports does have 3 small indoor dogs that often her outside. Denies any other skin changes or insect bites. States last tetanus immunization greater than 10 years ago.  Denies chest pain, shortness of breath, abdominal pain, dysuria, extremity pain, extremity swelling, or joint pain. Denies recent sickness. Denies recent antibiotic use.   Pauline Good, NP: PCP   Past Medical History:  Diagnosis Date  . GERD (gastroesophageal reflux disease)   . Hyperlipidemia   . Palpitations   . Scoliosis   . Vertigo    positional. monthly    Patient Active Problem List   Diagnosis Date Noted  . Special screening for malignant neoplasms, colon   . Mixed hyperlipidemia 12/10/2014  . Palpitations 12/05/2014  . Shortness of breath 12/05/2014  . Anxiety 02/21/2014  . GERD (gastroesophageal reflux disease) 02/21/2014    Past Surgical History:  Procedure Laterality Date  . BREAST BIOPSY Right 1985   removed third nipple  . CESAREAN SECTION  1996  . COLONOSCOPY WITH PROPOFOL N/A 10/22/2016   Procedure: COLONOSCOPY WITH PROPOFOL;  Surgeon: Lucilla Lame, MD;  Location: [place];  Service: Endoscopy;  Laterality: N/A;  .  ESOPHAGOGASTRODUODENOSCOPY (EGD) WITH ESOPHAGEAL DILATION  2010  . POLYPECTOMY  10/22/2016   Procedure: POLYPECTOMY;  Surgeon: Lucilla Lame, MD;  Location: [place];  Service: Endoscopy;;  . UTERINE FIBROID SURGERY  1995     No current facility-administered medications for this encounter.   Current Outpatient Prescriptions:  .  citalopram (CELEXA) 40 MG tablet, Take 20 mg by mouth daily. , Disp: , Rfl:  .  doxycycline (VIBRAMYCIN) 100 MG capsule, Take 1 capsule (100 mg total) by mouth 2 (two) times daily., Disp: 20 capsule, Rfl: 0 .  JUNEL FE 1/20 1-20 MG-MCG tablet, , Disp: , Rfl:  .  Multiple Vitamin (MULTIVITAMIN) tablet, Take 1 tablet by mouth daily., Disp: , Rfl:  .  mupirocin ointment (BACTROBAN) 2 %, Apply three times a day for 7 days., Disp: 22 g, Rfl: 0 .  pantoprazole (PROTONIX) 40 MG tablet, Take 1 tablet (40 mg total) by mouth daily., Disp: 90 tablet, Rfl: 3 .  simvastatin (ZOCOR) 40 MG tablet, TAKE 1 TABLET (40 MG TOTAL) BY MOUTH NIGHTLY., Disp: , Rfl: 4  Allergies Amoxicillin; Other; and Sulfamethoxazole-trimethoprim  History reviewed. No pertinent family history.  Social History Social History  Substance Use Topics  . Smoking status: Never Smoker  . Smokeless tobacco: Never Used  . Alcohol use No    Review of Systems Constitutional: No fever/chills Cardiovascular: Denies chest pain. Respiratory: Denies shortness of breath. Gastrointestinal: No abdominal pain.  Genitourinary: Negative for dysuria. Musculoskeletal: Negative for back pain. Skin: as above. ____________________________________________   PHYSICAL EXAM:  VITAL SIGNS: ED Triage Vitals  Enc Vitals Group     BP 05/25/17 1552 120/63  Pulse Rate 05/25/17 1552 66     Resp 05/25/17 1552 16     Temp 05/25/17 1552 97.7 F (36.5 C)     Temp Source 05/25/17 1552 Oral     SpO2 05/25/17 1552 100 %     Weight 05/25/17 1550 150 lb (68 kg)     Height 05/25/17 1550 5\' 2"  (1.575 m)     Head  Circumference --      Peak Flow --      Pain Score 05/25/17 1550 0     Pain Loc --      Pain Edu? --      Excl. in [place]? --     Constitutional: Alert and oriented. Well appearing and in no acute distress. Cardiovascular: Normal rate, regular rhythm. Grossly normal heart sounds.  Good peripheral circulation. Respiratory: Normal respiratory effort without tachypnea nor retractions. Breath sounds are clear and equal bilaterally. No wheezes, rales, rhonchi. Gastrointestinal: Soft and nontender. No distention.  Musculoskeletal: Steady gait.  Neurologic:  Normal speech and language. Speech is normal. No gait instability.  Skin:  Skin is warm, dry. Except: Right lower abdomen approximately 2 x 1.5 cm erythematous lesion with minimal surrounding erythema, no clear bull's-eye rash or central clearing, mild induration at centered punctum, no fluctuance, nontender, no foreign body visualized. Psychiatric: Mood and affect are normal. Speech and behavior are normal. Patient exhibits appropriate insight and judgment   ___________________________________________   LABS (all labs ordered are listed, but only abnormal results are displayed)  Labs Reviewed  ROCKY MTN SPOTTED FVR ABS PNL(IGG+IGM)  B. BURGDORFI ANTIBODIES     PROCEDURES Procedures   INITIAL IMPRESSION / ASSESSMENT AND PLAN / ED COURSE  Pertinent labs & imaging results that were available during my care of the patient were reviewed by me and considered in my medical decision making (see chart for details).  Well-appearing patient. Presents for evaluation of suspected insect bite to right lower abdomen. Clinical appearance is consistent with insect bite with concern of localized surrounding cellulitis. Also concern for tick bite. Discussed patient empiric treatment with oral doxycycline and topical Bactroban, and whether additional lab studies indicated. Patient requests to go ahead and have lymes and RMSF labs drawn. Tetanus  immunization updated. Encourage supportive care and close monitoring. Discussed indication, risks and benefits of medications with patient.  Discussed follow up with Primary care physician this week. Discussed follow up and return parameters including no resolution or any worsening concerns. Patient verbalized understanding and agreed to plan.   ____________________________________________   FINAL CLINICAL IMPRESSION(S) / ED DIAGNOSES  Final diagnoses:  Insect bite, initial encounter  Cellulitis, unspecified cellulitis site     Discharge Medication List as of 05/25/2017  4:18 PM    START taking these medications   Details  doxycycline (VIBRAMYCIN) 100 MG capsule Take 1 capsule (100 mg total) by mouth 2 (two) times daily., Starting Wed 05/25/2017, Normal    mupirocin ointment (BACTROBAN) 2 % Apply three times a day for 7 days., Normal        Note: This dictation was prepared with Dragon dictation along with smaller phrase technology. Any transcriptional errors that result from this process are unintentional.         Marylene Land, NP 05/25/17 1701

## 2017-05-26 LAB — B. BURGDORFI ANTIBODIES

## 2017-05-27 LAB — ROCKY MTN SPOTTED FVR ABS PNL(IGG+IGM)
RMSF IGM: 0.14 {index} (ref 0.00–0.89)
RMSF IgG: NEGATIVE

## 2017-06-27 ENCOUNTER — Ambulatory Visit
Admission: RE | Admit: 2017-06-27 | Discharge: 2017-06-27 | Disposition: A | Discharge status: Home / Self Care | Payer: Managed Care, Other (non HMO) | Source: Ambulatory Visit | Attending: Obstetrics and Gynecology | Admitting: Obstetrics and Gynecology

## 2017-06-27 ENCOUNTER — Encounter (HOSPITAL_COMMUNITY): Payer: Self-pay

## 2017-06-27 DIAGNOSIS — R928 Other abnormal and inconclusive findings on diagnostic imaging of breast: Secondary | ICD-10-CM | POA: Insufficient documentation

## 2017-06-27 DIAGNOSIS — Z1231 Encounter for screening mammogram for malignant neoplasm of breast: Secondary | ICD-10-CM | POA: Insufficient documentation

## 2017-06-29 ENCOUNTER — Other Ambulatory Visit: Payer: Self-pay | Admitting: Obstetrics and Gynecology

## 2017-06-29 DIAGNOSIS — R928 Other abnormal and inconclusive findings on diagnostic imaging of breast: Secondary | ICD-10-CM

## 2017-06-29 DIAGNOSIS — N6320 Unspecified lump in the left breast, unspecified quadrant: Secondary | ICD-10-CM

## 2017-07-13 ENCOUNTER — Ambulatory Visit
Admission: RE | Admit: 2017-07-13 | Discharge: 2017-07-13 | Disposition: A | Discharge status: Home / Self Care | Payer: Managed Care, Other (non HMO) | Source: Ambulatory Visit | Attending: Obstetrics and Gynecology | Admitting: Obstetrics and Gynecology

## 2017-07-13 DIAGNOSIS — R928 Other abnormal and inconclusive findings on diagnostic imaging of breast: Secondary | ICD-10-CM

## 2017-07-13 DIAGNOSIS — N6002 Solitary cyst of left breast: Secondary | ICD-10-CM | POA: Insufficient documentation

## 2017-07-13 DIAGNOSIS — N6320 Unspecified lump in the left breast, unspecified quadrant: Secondary | ICD-10-CM

## 2017-08-10 ENCOUNTER — Other Ambulatory Visit: Payer: Self-pay | Admitting: Gastroenterology

## 2018-02-08 DIAGNOSIS — W503XXA Accidental bite by another person, initial encounter: Secondary | ICD-10-CM

## 2018-02-08 HISTORY — DX: Accidental bite by another person, initial encounter: W50.3XXA

## 2018-03-28 ENCOUNTER — Other Ambulatory Visit: Payer: Self-pay

## 2018-03-28 ENCOUNTER — Encounter
Admission: RE | Admit: 2018-03-28 | Discharge: 2018-03-28 | Disposition: A | Discharge status: Home / Self Care | Payer: Managed Care, Other (non HMO) | Source: Ambulatory Visit | Attending: Obstetrics and Gynecology | Admitting: Obstetrics and Gynecology

## 2018-03-28 DIAGNOSIS — Z01812 Encounter for preprocedural laboratory examination: Secondary | ICD-10-CM | POA: Insufficient documentation

## 2018-03-28 HISTORY — DX: Major depressive disorder, single episode, unspecified: F32.9

## 2018-03-28 HISTORY — DX: Cardiac arrhythmia, unspecified: I49.9

## 2018-03-28 HISTORY — DX: Depression, unspecified: F32.A

## 2018-03-28 HISTORY — DX: Accidental bite by another person, initial encounter: W50.3XXA

## 2018-03-28 HISTORY — DX: Unspecified osteoarthritis, unspecified site: M19.90

## 2018-03-28 HISTORY — DX: Leiomyoma of uterus, unspecified: D25.9

## 2018-03-28 HISTORY — DX: Anxiety disorder, unspecified: F41.9

## 2018-03-28 LAB — TYPE AND SCREEN
ABO/RH(D): A POS
Antibody Screen: NEGATIVE

## 2018-03-28 LAB — BASIC METABOLIC PANEL
Anion gap: 7 (ref 5–15)
BUN: 11 mg/dL (ref 6–20)
CALCIUM: 9.5 mg/dL (ref 8.9–10.3)
CO2: 28 mmol/L (ref 22–32)
Chloride: 104 mmol/L (ref 101–111)
Creatinine, Ser: 0.72 mg/dL (ref 0.44–1.00)
GFR calc non Af Amer: >60 mL/min (ref >60)
Glucose, Bld: 81 mg/dL (ref 65–99)
POTASSIUM: 4.1 mmol/L (ref 3.5–5.1)
Sodium: 139 mmol/L (ref 135–145)

## 2018-03-28 NOTE — H&P (Signed)
Chief Complaint:    Patient ID: Carol Espinoza is a 57 y.o. female presenting with Pre Op Consulting (sign consents/preadmit to follow)  on 03/28/2018  HPI:  pt with hx of large fibroids, sp open myomectomy in past - hx of stat c/s -Patient is currently asymptomatic.  -Has been on oral contraception continuously until the end of September, at which time she stopped.  She has had no postmenopausal bleeding since that time. -Family history of maternal grandmother with ovarian and uterine cancer.  She is interested in risk reduction surgery for this reason.  She has no known genetic predisposition to gynecologic cancer.  -Ultrasound with multiple small fibroids, and a complex adnexal mass.    Ut 7x3x5cm  Workup:  Pap:neg 05/2017 EMBx: neg 12/2017     Past Medical History:  has a past medical history of Anxiety (02/21/2014), Fibroid (1994), GERD (gastroesophageal reflux disease) (02/21/2014), and Hypercholesteremia (02/21/2014).  Past Surgical History:  has a past surgical history that includes Laparoscopic Myomectomy; Cesarean section; and Breast surgery (1985?). Family History: family history includes Bone cancer in her other; Colon cancer in her maternal grandmother; Lung cancer in her other; Osteoporosis (Thinning of bones) in her maternal grandmother; Uterine cancer in her maternal grandmother. Social History:  reports that she has never smoked. She has never used smokeless tobacco. She reports that she does not drink alcohol or use drugs. OB/GYN History:  OB History    Gravida  1   Para  1   Term  1   Preterm      AB      Living  1     SAB      TAB      Ectopic      Molar      Multiple      Live Births             Allergies: is allergic to amoxicillin; bactrim [sulfamethoxazole-trimethoprim]; latex; and other. Medications:  Current Outpatient Medications:  .  citalopram (CELEXA) 40 MG tablet, Take 1 tablet (40 mg total) by mouth once daily., Disp: 90  tablet, Rfl: 3 .  pantoprazole (PROTONIX) 40 MG DR tablet, Take 1 tablet (40 mg total) by mouth once daily., Disp: 30 tablet, Rfl: 11 .  simvastatin (ZOCOR) 40 MG tablet, TAKE 1 TABLET (40 MG TOTAL) BY MOUTH NIGHTLY., Disp: 90 tablet, Rfl: 1 .  norethindrone-ethinyl estradiol (JUNEL FE 1/20, 28,) 1 mg-20 mcg (21)/75 mg (7) tablet, Take 1 tablet by mouth once daily. (Patient not taking: Reported on 09/05/2017 ), Disp: 28 tablet, Rfl: 0   Review of Systems: No SOB, no palpitations or chest pain, no new lower extremity edema, no nausea or vomiting or bowel or bladder complaints. See HPI for gyn specific ROS.   Exam:     BP 102/60   Pulse 87   Ht 157.5 cm (5\' 2" )   Wt 67.5 kg (148 lb 12.8 oz)   LMP 05/12/2017   BMI 27.22 kg/m   General: Patient is well-groomed, well-nourished, appears stated age in no acute distress   HEENT: head is atraumatic and normocephalic, trachea is midline, neck is supple with no palpable nodules   CV: Regular rhythm and normal heart rate, no murmur   Pulm: Clear to auscultation throughout lung fields with no wheezing, crackles, or rhonchi. No increased work of breathing  Abdomen: soft , no mass, non-tender, no rebound tenderness, no hepatomegaly  Pelvic: tanner stage 5 ,   External genitalia: vulva /labia no  lesions  Urethra: no prolapse  Vagina: normal physiologic d/c, laxity in vaginal walls  Cervix: no lesions, no cervical motion tenderness, poor descent  Uterus: normal size shape and contour, non-tender  Adnexa: no mass,  non-tender    Rectovaginal: External wnl   Impression:   The encounter diagnosis was Excessive or frequent menstruation.    Plan:    Patient returns for a preoperative discussion regarding her plans to proceed with surgical treatment of her fibroid uterus with heavy bleeding by total laparoscopic hysterectomy with bilateral salpingectomy, right oophorectomy procedure.  We will perform a cystoscopy to evaluate the urinary  tract after the procedure.   I have quoted her a 30% chance of needing to open with the prior open myomectomy and stat c/s. Will place three way Foley.  The patient and I discussed the technical aspects of the procedure including the potential for risks and complications.  These include but are not limited to the risk of infection requiring post-operative antibiotics or further procedures.  We talked about the risk of injury to adjacent organs including bladder, bowel, ureter, blood vessels or nerves.  We talked about the need to convert to an open incision.  We talked about the possible need for blood transfusion.  We talked about postop complications such as thromboembolic or cardiopulmonary complications.  All of her questions were answered.  Her preoperative exam was completed and the appropriate consents were signed. She is scheduled to undergo this procedure in the near future.  Specific Peri-operative Considerations:  - Consent: obtained today - Health Maintenance: up to date - Labs: CBC, CMP preoperatively - Studies: EKG, CXR preoperatively - Bowel Preparation: None required - Abx:  Ancef 2 g- vomiting with prior dosing - VTE ppx: SCDs perioperatively - Glucose Protocol: n/a - Beta-blockade: n/a   Return for Postop check.  Sherrie George, MD

## 2018-03-28 NOTE — Pre-Procedure Instructions (Signed)
Patient provided written and verbal instructions on proper use of incentive spirometer. Able to provide a return demonstration. Also instructed her on TCDB exercises. Verbalized understanding of both.

## 2018-03-28 NOTE — Patient Instructions (Signed)
Your procedure is scheduled on: Monday, April 03, 2018  Report to [place].            DO NOT STOP ON THE FIRST FLOOR OF THE MEDICAL MALL  To find out your arrival time please call [phone] between 1PM - 3PM on Friday, March 31, 2018  Remember: Instructions that are not followed completely may result in serious medical risk,  up to and including death, or upon the discretion of your surgeon and anesthesiologist your  surgery may need to be rescheduled.     _X__ 1. Do not eat food after midnight the night before your procedure.                 No gum chewing or hard candies. ABSOLUTELY NOTHING IN YOUR MOUTH AFTER MIDNIGHT                  You may drink clear liquids up to 2 hours before you are scheduled to arrive for your surgery-                   DO not drink clear liquids within 2 hours of the start of your surgery.                  Clear Liquids include:  water, apple juice without pulp, clear carbohydrate                 drink such as Clearfast of Gatorade, Black Coffee or Tea (Do not add                 anything to coffee or tea).  __X__2.  On the morning of surgery brush your teeth with toothpaste and water,                      you may rinse your mouth with mouthwash if you wish.                          Do not swallow any toothpaste of mouthwash.     _X__ 3.  No Alcohol for 24 hours before or after surgery.   _X__ 4.  Do Not Smoke or use e-cigarettes For 24 Hours Prior to Your Surgery.                 Do not use any chewable tobacco products for at least 6 hours prior to                 surgery.  ____  5.  Bring all medications with you on the day of surgery if instructed.   ____  6.  Notify your doctor if there is any change in your medical condition      (cold, fever, infections).     Do not wear jewelry, make-up, hairpins, clips or nail polish. Do not wear lotions, powders, or perfumes. You may wear  deodorant. Do not shave 48 hours prior to surgery. Men may shave face and neck. Do not bring valuables to the hospital.    [facility] is not responsible for any belongings or valuables.  Contacts, dentures or bridgework may not be worn into surgery. Leave your suitcase in the car. After surgery it may be brought to your room. For patients admitted to the hospital, discharge time is determined by your treatment team.   Patients discharged the day of surgery will  not be allowed to drive home.   Please read over the following fact sheets that you were given:   PREPARING FOR SURGERY  ____ Take these medicines the morning of surgery with A SIP OF WATER:    1. PANTOPRAZOLE  2.   3.   4.  5.  6.  ____ Fleet Enema (as directed)   __X__ Use CHG Soap as directed  __X__ Stop ALL ASPIRIN PRODUCTS TODAY  _X___ Stop Anti-inflammatories AS OF TODAY                    THIS INCLUDES IBUPROFEN / MOTRIN / ADVIL / ALEVE   __X__ Stop supplements until after surgery.                      THIS INCLUDES MULTIVITAMINS  ____ Bring C-Pap to the hospital.   HAVE STOOL SOFTENERS AT [place] WEEKEND. DO NOT TAKE ON DAY OF SURGERY.  CONTINUE TAKING ZOCOR AND CITALOPRAM AS USUAL.  HAVE A FIRM PILLOW FOR HOME.  BRING THE INCENTIVE SPIROMETER WITH YOU   IF YOU HAVE YOUR ADVANCE MEDICAL DIRECTIVES, PLEASE BRING SO WE MAY MAKE A COPY FOR YOUR CHART  WHEN USING THE SPECIAL SOAP, DO NOT USE ABOVE YOUR NECK OR ON YOUR PRIVATE PARTS.

## 2018-03-29 ENCOUNTER — Other Ambulatory Visit: Payer: Managed Care, Other (non HMO)

## 2018-04-02 MED ORDER — CEFAZOLIN SODIUM-DEXTROSE 2-4 GM/100ML-% IV SOLN
2.0000 g | INTRAVENOUS | Status: AC
  Administered 2018-04-03: 2 g via INTRAVENOUS

## 2018-04-03 ENCOUNTER — Ambulatory Visit: Payer: Managed Care, Other (non HMO) | Admitting: Certified Registered"

## 2018-04-03 ENCOUNTER — Encounter
Admission: RE | Disposition: A | Discharge status: Home / Self Care | Payer: Self-pay | Source: Ambulatory Visit | Attending: Obstetrics and Gynecology

## 2018-04-03 ENCOUNTER — Observation Stay
Admission: RE | Admit: 2018-04-03 | Discharge: 2018-04-04 | Disposition: A | Discharge status: Home / Self Care | Payer: Managed Care, Other (non HMO) | Source: Ambulatory Visit | Attending: Obstetrics and Gynecology | Admitting: Obstetrics and Gynecology

## 2018-04-03 ENCOUNTER — Encounter: Payer: Self-pay | Admitting: Emergency Medicine

## 2018-04-03 ENCOUNTER — Other Ambulatory Visit: Payer: Self-pay

## 2018-04-03 DIAGNOSIS — F329 Major depressive disorder, single episode, unspecified: Secondary | ICD-10-CM | POA: Diagnosis not present

## 2018-04-03 DIAGNOSIS — M19022 Primary osteoarthritis, left elbow: Secondary | ICD-10-CM | POA: Insufficient documentation

## 2018-04-03 DIAGNOSIS — K219 Gastro-esophageal reflux disease without esophagitis: Secondary | ICD-10-CM | POA: Insufficient documentation

## 2018-04-03 DIAGNOSIS — Z882 Allergy status to sulfonamides status: Secondary | ICD-10-CM | POA: Diagnosis not present

## 2018-04-03 DIAGNOSIS — F419 Anxiety disorder, unspecified: Secondary | ICD-10-CM | POA: Insufficient documentation

## 2018-04-03 DIAGNOSIS — M19021 Primary osteoarthritis, right elbow: Secondary | ICD-10-CM | POA: Insufficient documentation

## 2018-04-03 DIAGNOSIS — N920 Excessive and frequent menstruation with regular cycle: Principal | ICD-10-CM | POA: Insufficient documentation

## 2018-04-03 DIAGNOSIS — Z91040 Latex allergy status: Secondary | ICD-10-CM | POA: Insufficient documentation

## 2018-04-03 DIAGNOSIS — M419 Scoliosis, unspecified: Secondary | ICD-10-CM | POA: Diagnosis not present

## 2018-04-03 DIAGNOSIS — Z8041 Family history of malignant neoplasm of ovary: Secondary | ICD-10-CM | POA: Diagnosis not present

## 2018-04-03 DIAGNOSIS — N83291 Other ovarian cyst, right side: Secondary | ICD-10-CM | POA: Insufficient documentation

## 2018-04-03 DIAGNOSIS — Z800 Family history of malignant neoplasm of digestive organs: Secondary | ICD-10-CM | POA: Insufficient documentation

## 2018-04-03 DIAGNOSIS — N888 Other specified noninflammatory disorders of cervix uteri: Secondary | ICD-10-CM | POA: Insufficient documentation

## 2018-04-03 DIAGNOSIS — D252 Subserosal leiomyoma of uterus: Secondary | ICD-10-CM | POA: Diagnosis not present

## 2018-04-03 DIAGNOSIS — Z8049 Family history of malignant neoplasm of other genital organs: Secondary | ICD-10-CM | POA: Insufficient documentation

## 2018-04-03 DIAGNOSIS — M170 Bilateral primary osteoarthritis of knee: Secondary | ICD-10-CM | POA: Insufficient documentation

## 2018-04-03 DIAGNOSIS — E7800 Pure hypercholesterolemia, unspecified: Secondary | ICD-10-CM | POA: Insufficient documentation

## 2018-04-03 DIAGNOSIS — D259 Leiomyoma of uterus, unspecified: Secondary | ICD-10-CM | POA: Diagnosis present

## 2018-04-03 DIAGNOSIS — Z79899 Other long term (current) drug therapy: Secondary | ICD-10-CM | POA: Insufficient documentation

## 2018-04-03 DIAGNOSIS — Z880 Allergy status to penicillin: Secondary | ICD-10-CM | POA: Insufficient documentation

## 2018-04-03 HISTORY — PX: CYSTOSCOPY: SHX5120

## 2018-04-03 HISTORY — PX: LAPAROSCOPIC UNILATERAL SALPINGECTOMY: SHX5934

## 2018-04-03 HISTORY — PX: LAPAROSCOPIC HYSTERECTOMY: SHX1926

## 2018-04-03 HISTORY — PX: LAPAROSCOPIC UNILATERAL SALPINGO OOPHERECTOMY: SHX5935

## 2018-04-03 LAB — ABO/RH: ABO/RH(D): A POS

## 2018-04-03 LAB — POCT PREGNANCY, URINE: PREG TEST UR: NEGATIVE

## 2018-04-03 SURGERY — HYSTERECTOMY, TOTAL, LAPAROSCOPIC
Anesthesia: General | Laterality: Right

## 2018-04-03 MED ORDER — SODIUM CHLORIDE 0.9 % IR SOLN
Status: DC | PRN
  Administered 2018-04-03: 100 mL via INTRAVESICAL

## 2018-04-03 MED ORDER — OXYCODONE HCL 5 MG PO CAPS: 5.0000 mg | ORAL_CAPSULE | Freq: Four times a day (QID) | ORAL | 0 refills | Status: DC | PRN

## 2018-04-03 MED ORDER — SIMVASTATIN 40 MG PO TABS
40.0000 mg | ORAL_TABLET | Freq: Every day | ORAL | Status: DC
Start: 2018-04-03 — End: 2018-04-04
  Administered 2018-04-03: 40 mg via ORAL
  Filled 2018-04-03 (×2): qty 1

## 2018-04-03 MED ORDER — IPRATROPIUM-ALBUTEROL 0.5-2.5 (3) MG/3ML IN SOLN
RESPIRATORY_TRACT | Status: AC
  Administered 2018-04-03: 3 mL
  Filled 2018-04-03: qty 3

## 2018-04-03 MED ORDER — DOCUSATE SODIUM 100 MG PO CAPS
100.0000 mg | ORAL_CAPSULE | Freq: Two times a day (BID) | ORAL | Status: DC
  Administered 2018-04-03 – 2018-04-04 (×2): 100 mg via ORAL
  Filled 2018-04-03 (×2): qty 1

## 2018-04-03 MED ORDER — GLYCOPYRROLATE 0.2 MG/ML IJ SOLN
INTRAMUSCULAR | Status: DC | PRN
  Administered 2018-04-03 (×2): 0.2 mg via INTRAVENOUS

## 2018-04-03 MED ORDER — SODIUM CHLORIDE FLUSH 0.9 % IV SOLN
INTRAVENOUS | Status: AC
  Filled 2018-04-03: qty 10

## 2018-04-03 MED ORDER — IPRATROPIUM-ALBUTEROL 0.5-2.5 (3) MG/3ML IN SOLN
3.0000 mL | RESPIRATORY_TRACT | Status: DC
Start: 2018-04-03 — End: 2018-04-03

## 2018-04-03 MED ORDER — ACETAMINOPHEN 500 MG PO TABS
1000.0000 mg | ORAL_TABLET | Freq: Four times a day (QID) | ORAL | Status: DC
  Administered 2018-04-03 – 2018-04-04 (×5): 1000 mg via ORAL
  Filled 2018-04-03 (×5): qty 2

## 2018-04-03 MED ORDER — PHENYLEPHRINE HCL 10 MG/ML IJ SOLN
INTRAMUSCULAR | Status: DC | PRN
  Administered 2018-04-03 (×4): 100 ug via INTRAVENOUS

## 2018-04-03 MED ORDER — GABAPENTIN 300 MG PO CAPS
ORAL_CAPSULE | ORAL | Status: AC
  Filled 2018-04-03: qty 3

## 2018-04-03 MED ORDER — ADULT MULTIVITAMIN W/MINERALS CH: 1.0000 | ORAL_TABLET | ORAL | Status: DC

## 2018-04-03 MED ORDER — KETOROLAC TROMETHAMINE 30 MG/ML IJ SOLN
30.0000 mg | Freq: Four times a day (QID) | INTRAMUSCULAR | Status: AC
  Administered 2018-04-03 – 2018-04-04 (×3): 30 mg via INTRAVENOUS
  Filled 2018-04-03 (×3): qty 1

## 2018-04-03 MED ORDER — LORATADINE 10 MG PO TABS
10.0000 mg | ORAL_TABLET | Freq: Every day | ORAL | Status: DC | PRN
  Filled 2018-04-03: qty 1

## 2018-04-03 MED ORDER — ACETAMINOPHEN 500 MG PO TABS: 1000.0000 mg | ORAL_TABLET | Freq: Four times a day (QID) | ORAL | 0 refills | Status: AC

## 2018-04-03 MED ORDER — FAMOTIDINE 20 MG PO TABS
20.0000 mg | ORAL_TABLET | Freq: Once | ORAL | Status: AC
  Administered 2018-04-03: 20 mg via ORAL

## 2018-04-03 MED ORDER — PANTOPRAZOLE SODIUM 40 MG PO TBEC
40.0000 mg | DELAYED_RELEASE_TABLET | Freq: Every day | ORAL | Status: DC
  Administered 2018-04-04: 40 mg via ORAL
  Filled 2018-04-03 (×2): qty 1

## 2018-04-03 MED ORDER — OXYCODONE HCL 5 MG PO TABS
5.0000 mg | ORAL_TABLET | ORAL | Status: DC | PRN
Start: 2018-04-03 — End: 2018-04-04

## 2018-04-03 MED ORDER — DEXAMETHASONE SODIUM PHOSPHATE 10 MG/ML IJ SOLN
INTRAMUSCULAR | Status: DC | PRN
  Administered 2018-04-03: 10 mg via INTRAVENOUS

## 2018-04-03 MED ORDER — CITALOPRAM HYDROBROMIDE 20 MG PO TABS
40.0000 mg | ORAL_TABLET | Freq: Every day | ORAL | Status: DC
  Administered 2018-04-03: 40 mg via ORAL
  Filled 2018-04-03 (×2): qty 2

## 2018-04-03 MED ORDER — GABAPENTIN 800 MG PO TABS: 800.0000 mg | ORAL_TABLET | Freq: Every day | ORAL | 0 refills | Status: DC

## 2018-04-03 MED ORDER — KETOROLAC TROMETHAMINE 30 MG/ML IJ SOLN
INTRAMUSCULAR | Status: AC
  Filled 2018-04-03: qty 1

## 2018-04-03 MED ORDER — ONDANSETRON HCL 4 MG/2ML IJ SOLN
INTRAMUSCULAR | Status: AC
  Filled 2018-04-03: qty 2

## 2018-04-03 MED ORDER — MIDAZOLAM HCL 2 MG/2ML IJ SOLN
INTRAMUSCULAR | Status: DC | PRN
  Administered 2018-04-03: 3 mg via INTRAVENOUS
  Administered 2018-04-03: 2 mg via INTRAVENOUS

## 2018-04-03 MED ORDER — KETAMINE HCL 10 MG/ML IJ SOLN
INTRAMUSCULAR | Status: DC | PRN
  Administered 2018-04-03: 20 mg via INTRAVENOUS
  Administered 2018-04-03: 30 mg via INTRAVENOUS

## 2018-04-03 MED ORDER — ROCURONIUM BROMIDE 50 MG/5ML IV SOLN
INTRAVENOUS | Status: AC
  Filled 2018-04-03: qty 1

## 2018-04-03 MED ORDER — ONDANSETRON HCL 4 MG PO TABS: 4.0000 mg | ORAL_TABLET | Freq: Four times a day (QID) | ORAL | Status: DC | PRN

## 2018-04-03 MED ORDER — ONDANSETRON HCL 4 MG/2ML IJ SOLN: 4.0000 mg | Freq: Four times a day (QID) | INTRAMUSCULAR | Status: DC | PRN

## 2018-04-03 MED ORDER — DEXAMETHASONE SODIUM PHOSPHATE 10 MG/ML IJ SOLN
INTRAMUSCULAR | Status: AC
  Filled 2018-04-03: qty 1

## 2018-04-03 MED ORDER — ALUM & MAG HYDROXIDE-SIMETH 200-200-20 MG/5ML PO SUSP: 30.0000 mL | ORAL | Status: DC | PRN

## 2018-04-03 MED ORDER — NALOXONE HCL 2 MG/2ML IJ SOSY
PREFILLED_SYRINGE | INTRAMUSCULAR | Status: AC
  Filled 2018-04-03: qty 2

## 2018-04-03 MED ORDER — HYDROMORPHONE HCL 1 MG/ML IJ SOLN: 0.2000 mg | INTRAMUSCULAR | Status: DC | PRN

## 2018-04-03 MED ORDER — MIDAZOLAM HCL 5 MG/5ML IJ SOLN
INTRAMUSCULAR | Status: AC
  Filled 2018-04-03: qty 5

## 2018-04-03 MED ORDER — NALOXONE HCL 0.4 MG/ML IJ SOLN
INTRAMUSCULAR | Status: DC | PRN
  Administered 2018-04-03 (×5): .05 mg via INTRAVENOUS

## 2018-04-03 MED ORDER — LIDOCAINE HCL (PF) 2 % IJ SOLN
INTRAMUSCULAR | Status: AC
  Filled 2018-04-03: qty 10

## 2018-04-03 MED ORDER — ONDANSETRON HCL 4 MG/2ML IJ SOLN
4.0000 mg | Freq: Once | INTRAMUSCULAR | Status: AC | PRN
  Administered 2018-04-03: 4 mg via INTRAVENOUS

## 2018-04-03 MED ORDER — FLUMAZENIL 0.5 MG/5ML IV SOLN
0.2000 mg | Freq: Once | INTRAVENOUS | Status: AC
  Administered 2018-04-03: 0.2 mg via INTRAVENOUS

## 2018-04-03 MED ORDER — CEFAZOLIN SODIUM-DEXTROSE 2-4 GM/100ML-% IV SOLN
INTRAVENOUS | Status: AC
  Filled 2018-04-03: qty 100

## 2018-04-03 MED ORDER — DOCUSATE SODIUM 100 MG PO CAPS: 100.0000 mg | ORAL_CAPSULE | Freq: Two times a day (BID) | ORAL | 0 refills | Status: DC

## 2018-04-03 MED ORDER — SIMETHICONE 80 MG PO CHEW
80.0000 mg | CHEWABLE_TABLET | Freq: Four times a day (QID) | ORAL | Status: DC | PRN
  Administered 2018-04-04 (×2): 80 mg via ORAL
  Filled 2018-04-03 (×2): qty 1

## 2018-04-03 MED ORDER — ROCURONIUM BROMIDE 100 MG/10ML IV SOLN
INTRAVENOUS | Status: DC | PRN
  Administered 2018-04-03: 50 mg via INTRAVENOUS

## 2018-04-03 MED ORDER — LACTATED RINGERS IV SOLN
INTRAVENOUS | Status: DC
  Administered 2018-04-03 (×2): via INTRAVENOUS

## 2018-04-03 MED ORDER — FAMOTIDINE 20 MG PO TABS
ORAL_TABLET | ORAL | Status: AC
  Filled 2018-04-03: qty 1

## 2018-04-03 MED ORDER — KETOROLAC TROMETHAMINE 30 MG/ML IJ SOLN: 30.0000 mg | Freq: Once | INTRAMUSCULAR | Status: DC

## 2018-04-03 MED ORDER — IBUPROFEN 800 MG PO TABS: 800.0000 mg | ORAL_TABLET | Freq: Three times a day (TID) | ORAL | 1 refills | Status: DC | PRN

## 2018-04-03 MED ORDER — KETAMINE HCL 50 MG/ML IJ SOLN
INTRAMUSCULAR | Status: AC
  Filled 2018-04-03: qty 10

## 2018-04-03 MED ORDER — ACETAMINOPHEN 500 MG PO TABS
1000.0000 mg | ORAL_TABLET | ORAL | Status: AC
  Administered 2018-04-03: 1000 mg via ORAL

## 2018-04-03 MED ORDER — BUPIVACAINE HCL (PF) 0.5 % IJ SOLN
INTRAMUSCULAR | Status: AC
  Filled 2018-04-03: qty 30

## 2018-04-03 MED ORDER — ONDANSETRON HCL 4 MG/2ML IJ SOLN
INTRAMUSCULAR | Status: DC | PRN
  Administered 2018-04-03: 4 mg via INTRAVENOUS

## 2018-04-03 MED ORDER — SUGAMMADEX SODIUM 200 MG/2ML IV SOLN
INTRAVENOUS | Status: DC | PRN
  Administered 2018-04-03: 200 mg via INTRAVENOUS

## 2018-04-03 MED ORDER — HYDROMORPHONE HCL 1 MG/ML IJ SOLN
INTRAMUSCULAR | Status: DC | PRN
  Administered 2018-04-03 (×2): 1 mg via INTRAVENOUS

## 2018-04-03 MED ORDER — KETOROLAC TROMETHAMINE 30 MG/ML IJ SOLN
INTRAMUSCULAR | Status: DC | PRN
  Administered 2018-04-03: 30 mg via INTRAVENOUS

## 2018-04-03 MED ORDER — GABAPENTIN 300 MG PO CAPS
900.0000 mg | ORAL_CAPSULE | ORAL | Status: AC
Start: 2018-04-03 — End: 2018-04-03
  Administered 2018-04-03: 900 mg via ORAL

## 2018-04-03 MED ORDER — SUGAMMADEX SODIUM 200 MG/2ML IV SOLN
INTRAVENOUS | Status: AC
  Filled 2018-04-03: qty 2

## 2018-04-03 MED ORDER — BUPIVACAINE HCL 0.5 % IJ SOLN
INTRAMUSCULAR | Status: DC | PRN
  Administered 2018-04-03: 10 mL

## 2018-04-03 MED ORDER — PROPOFOL 10 MG/ML IV BOLUS
INTRAVENOUS | Status: DC | PRN
  Administered 2018-04-03: 100 mg via INTRAVENOUS

## 2018-04-03 MED ORDER — ACETAMINOPHEN 500 MG PO TABS
ORAL_TABLET | ORAL | Status: AC
  Filled 2018-04-03: qty 2

## 2018-04-03 MED ORDER — FLUMAZENIL 0.5 MG/5ML IV SOLN
INTRAVENOUS | Status: AC
  Administered 2018-04-03: 0.2 mg via INTRAVENOUS
  Filled 2018-04-03: qty 5

## 2018-04-03 MED ORDER — FLUMAZENIL 1 MG/10ML IV SOLN
0.2000 mg | Freq: Once | INTRAVENOUS | Status: AC
  Administered 2018-04-03: 0.2 mg via INTRAVENOUS

## 2018-04-03 MED ORDER — MENTHOL 3 MG MT LOZG
1.0000 | LOZENGE | OROMUCOSAL | Status: DC | PRN
  Filled 2018-04-03: qty 9

## 2018-04-03 MED ORDER — KETOROLAC TROMETHAMINE 30 MG/ML IJ SOLN: 30.0000 mg | Freq: Four times a day (QID) | INTRAMUSCULAR | Status: AC

## 2018-04-03 MED ORDER — HYDROMORPHONE HCL 1 MG/ML IJ SOLN
INTRAMUSCULAR | Status: AC
  Filled 2018-04-03: qty 2

## 2018-04-03 MED ORDER — LIDOCAINE HCL (CARDIAC) PF 100 MG/5ML IV SOSY
PREFILLED_SYRINGE | INTRAVENOUS | Status: DC | PRN
  Administered 2018-04-03: 50 mg via INTRAVENOUS

## 2018-04-03 MED ORDER — PHENYLEPHRINE HCL 10 MG/ML IJ SOLN
INTRAMUSCULAR | Status: AC
  Filled 2018-04-03: qty 1

## 2018-04-03 MED ORDER — LACTATED RINGERS IV BOLUS: 500.0000 mL | Freq: Once | INTRAVENOUS | Status: DC

## 2018-04-03 MED ORDER — FENTANYL CITRATE (PF) 100 MCG/2ML IJ SOLN: 25.0000 ug | INTRAMUSCULAR | Status: DC | PRN

## 2018-04-03 MED ORDER — GLYCOPYRROLATE 0.2 MG/ML IJ SOLN
INTRAMUSCULAR | Status: AC
  Filled 2018-04-03: qty 2

## 2018-04-03 MED ORDER — GABAPENTIN 300 MG PO CAPS
900.0000 mg | ORAL_CAPSULE | Freq: Every day | ORAL | Status: DC
  Administered 2018-04-03: 900 mg via ORAL
  Filled 2018-04-03: qty 3

## 2018-04-03 MED ORDER — PROPOFOL 10 MG/ML IV BOLUS
INTRAVENOUS | Status: AC
  Filled 2018-04-03: qty 20

## 2018-04-03 MED ORDER — LACTATED RINGERS IV SOLN
INTRAVENOUS | Status: DC
  Administered 2018-04-03: 19:00:00 via INTRAVENOUS

## 2018-04-03 SURGICAL SUPPLY — 60 items
BAG URINE DRAINAGE (UROLOGICAL SUPPLIES) ×7 IMPLANT
BLADE SURG SZ11 CARB STEEL (BLADE) ×7 IMPLANT
CATH FOLEY 2WAY  5CC 16FR (CATHETERS) ×2
CATH URTH 16FR FL 2W BLN LF (CATHETERS) ×5 IMPLANT
CHLORAPREP W/TINT 26ML (MISCELLANEOUS) ×7 IMPLANT
CLOSURE WOUND 1/4X4 (GAUZE/BANDAGES/DRESSINGS)
CORD MONOPOLAR M/FML 12FT (MISCELLANEOUS) ×7 IMPLANT
COUNTER NEEDLE 20/40 LG (NEEDLE) ×7 IMPLANT
COVER LIGHT HANDLE STERIS (MISCELLANEOUS) ×14 IMPLANT
DERMABOND ADVANCED (GAUZE/BANDAGES/DRESSINGS) ×2
DERMABOND ADVANCED .7 DNX12 (GAUZE/BANDAGES/DRESSINGS) ×5 IMPLANT
DEVICE SUTURE ENDOST 10MM (ENDOMECHANICALS) ×7 IMPLANT
DRAPE STERI POUCH LG 24X46 STR (DRAPES) ×7 IMPLANT
DRSG TEGADERM 2-3/8X2-3/4 SM (GAUZE/BANDAGES/DRESSINGS) ×21 IMPLANT
GLOVE BIO SURGEON STRL SZ7 (GLOVE) ×28 IMPLANT
GLOVE INDICATOR 7.5 STRL GRN (GLOVE) ×28 IMPLANT
GOWN STRL REUS W/ TWL LRG LVL3 (GOWN DISPOSABLE) ×10 IMPLANT
GOWN STRL REUS W/ TWL XL LVL3 (GOWN DISPOSABLE) ×5 IMPLANT
GOWN STRL REUS W/TWL LRG LVL3 (GOWN DISPOSABLE) ×4
GOWN STRL REUS W/TWL XL LVL3 (GOWN DISPOSABLE) ×2
GRASPER SUT TROCAR 14GX15 (MISCELLANEOUS) ×7 IMPLANT
IRRIGATION STRYKERFLOW (MISCELLANEOUS) ×5 IMPLANT
IRRIGATOR STRYKERFLOW (MISCELLANEOUS) ×7
IV NS 1000ML (IV SOLUTION) ×2
IV NS 1000ML BAXH (IV SOLUTION) ×5 IMPLANT
KIT PINK PAD W/HEAD ARE REST (MISCELLANEOUS) ×7
KIT PINK PAD W/HEAD ARM REST (MISCELLANEOUS) ×5 IMPLANT
KIT TURNOVER CYSTO (KITS) ×7 IMPLANT
LABEL OR SOLS (LABEL) ×7 IMPLANT
LIGASURE VESSEL 5MM BLUNT TIP (ELECTROSURGICAL) IMPLANT
MANIPULATOR VCARE LG CRV RETR (MISCELLANEOUS) IMPLANT
MANIPULATOR VCARE SML CRV RETR (MISCELLANEOUS) IMPLANT
MANIPULATOR VCARE STD CRV RETR (MISCELLANEOUS) ×7 IMPLANT
NS IRRIG 500ML POUR BTL (IV SOLUTION) ×7 IMPLANT
OCCLUDER COLPOPNEUMO (BALLOONS) ×7 IMPLANT
PACK GYN LAPAROSCOPIC (MISCELLANEOUS) ×7 IMPLANT
PAD OB MATERNITY 4.3X12.25 (PERSONAL CARE ITEMS) ×7 IMPLANT
PAD PREP 24X41 OB/GYN DISP (PERSONAL CARE ITEMS) ×7 IMPLANT
POUCH SPECIMEN RETRIEVAL 10MM (ENDOMECHANICALS) IMPLANT
SCISSORS METZENBAUM CVD 33 (INSTRUMENTS) IMPLANT
SET CYSTO W/LG BORE CLAMP LF (SET/KITS/TRAYS/PACK) ×7 IMPLANT
SLEEVE ENDOPATH XCEL 5M (ENDOMECHANICALS) ×7 IMPLANT
SPONGE GAUZE 2X2 8PLY STER LF (GAUZE/BANDAGES/DRESSINGS)
SPONGE GAUZE 2X2 8PLY STRL LF (GAUZE/BANDAGES/DRESSINGS) IMPLANT
STRIP CLOSURE SKIN 1/4X4 (GAUZE/BANDAGES/DRESSINGS) IMPLANT
SUT ENDO VLOC 180-0-8IN (SUTURE) ×7 IMPLANT
SUT MNCRL 4-0 (SUTURE) ×2
SUT MNCRL 4-0 27XMFL (SUTURE) ×5
SUT MNCRL AB 4-0 PS2 18 (SUTURE) ×7 IMPLANT
SUT VIC AB 0 CT1 36 (SUTURE) ×14 IMPLANT
SUT VIC AB 2-0 UR6 27 (SUTURE) IMPLANT
SUT VIC AB 4-0 SH 27 (SUTURE) ×2
SUT VIC AB 4-0 SH 27XANBCTRL (SUTURE) ×5 IMPLANT
SUTURE MNCRL 4-0 27XMF (SUTURE) ×5 IMPLANT
SYR 10ML LL (SYRINGE) ×7 IMPLANT
SYR 50ML LL SCALE MARK (SYRINGE) ×7 IMPLANT
TROCAR ENDO BLADELESS 11MM (ENDOMECHANICALS) ×7 IMPLANT
TROCAR XCEL NON-BLD 5MMX100MML (ENDOMECHANICALS) ×7 IMPLANT
TUBING INSUF HEATED (TUBING) ×7 IMPLANT
TUBING INSUFFLATION (TUBING) ×7 IMPLANT

## 2018-04-03 NOTE — Progress Notes (Signed)
Dr Leafy Ro called will admit pt for low sats

## 2018-04-03 NOTE — Anesthesia Procedure Notes (Signed)
Procedure Name: Intubation Date/Time: 04/03/2018 8:04 AM Performed by: Nile Riggs, CRNA Pre-anesthesia Checklist: Patient identified, Emergency Drugs available, Suction available, Patient being monitored and Timeout performed Patient Re-evaluated:Patient Re-evaluated prior to induction Oxygen Delivery Method: Circle system utilized Preoxygenation: Pre-oxygenation with 100% oxygen Induction Type: IV induction and Cricoid Pressure applied Ventilation: Mask ventilation without difficulty Laryngoscope Size: Miller and 2 Grade View: Grade I Tube type: Oral Tube size: 7.0 mm Number of attempts: 1 Airway Equipment and Method: Stylet Placement Confirmation: ETT inserted through vocal cords under direct vision,  positive ETCO2,  CO2 detector and breath sounds checked- equal and bilateral Secured at: 21 cm Tube secured with: Tape Dental Injury: Teeth and Oropharynx as per pre-operative assessment

## 2018-04-03 NOTE — Discharge Instructions (Signed)
Discharge instructions after   total laparoscopic hysterectomy   For the next three days, take ibuprofen and acetaminophen on a schedule, every 8 hours. You can take them together or you can intersperse them, and take one every four hours. I also gave you gabapentin for nighttime, to help you sleep and also to control pain. Take gabapentin medicines at night for at least the next 3 nights. You also have a narcotic, oxycodone, to take as needed if the above medicines don't help.  Postop constipation is a major cause of pain. Stay well hydrated, walk as you tolerate, and take over the counter senna as well as stool softeners if you need them.    Signs and Symptoms to Report Call our office at [phone] if you have any of the following.  . Fever over 100.4 degrees or higher . Severe stomach pain not relieved with pain medications . Bright red bleeding that's heavier than a period that does not slow with rest . To go the bathroom a lot (frequency), you can't hold your urine (urgency), or it hurts when you empty your bladder (urinate) . Chest pain . Shortness of breath . Pain in the calves of your legs . Severe nausea and vomiting not relieved with anti-nausea medications . Signs of infection around your wounds, such as redness, hot to touch, swelling, green/yellow drainage (like pus), bad smelling discharge . Any concerns  What You Can Expect after Surgery . You may see some pink tinged, bloody fluid and bruising around the wound. This is normal. . You may notice shoulder and neck pain. This is caused by the gas used during surgery to expand your abdomen so your surgeon could get to the uterus easier. . You may have a sore throat because of the tube in your mouth during general anesthesia. This will go away in 2 to 3 days. . You may have some stomach cramps. . You may notice spotting on your panties. . You may have pain around the incision sites.   Activities after Your  Discharge Follow these guidelines to help speed your recovery at home: . Do the coughing and deep breathing as you did in the hospital for 2 weeks. Use the small blue breathing device, called the incentive spirometer for 2 weeks. . Don't drive if you are in pain or taking narcotic pain medicine. You may drive when you can safely slam on the brakes, turn the wheel forcefully, and rotate your torso comfortably. This is typically 1-2 weeks. Practice in a parking lot or side street prior to attempting to drive regularly.  . Ask others to help with household chores for 4 weeks. . Do not lift anything heavier that 10 pounds for 4-6 weeks. This includes pets, children, and groceries. . Don't do strenuous activities, exercises, or sports like vacuuming, tennis, squash, etc. until your doctor says it is safe to do so. ---Maintain pelvic rest for 8 weeks. This means nothing in the vagina or rectum at all (no douching, tampons, intercourse) for 8 weeks.  . Walk as you feel able. Rest often since it may take two or three weeks for your energy level to return to normal.  . You may climb stairs . Avoid constipation:   -Eat fruits, vegetables, and whole grains. Eat small meals as your appetite will take time to return to normal.   -Drink 6 to 8 glasses of water each day unless your doctor has told you to limit your fluids.   -Use a laxative   or stool softener as needed if constipation becomes a problem. You may take Miralax, metamucil, Citrucil, Colace, Senekot, FiberCon, etc. If this does not relieve the constipation, try two tablespoons of Milk Of Magnesia every 8 hours until your bowels move.  °• You may shower. Gently wash the wounds with a mild soap and water. Pat dry. °• Do not get in a hot tub, swimming pool, etc. for 6 weeks. °• Do not use lotions, oils, powders on the wounds. °• Do not douche, use tampons, or have sex until your doctor says it is okay. °• Take your pain medicine when you need it. The medicine  may not work as well if the pain is bad. ° °Take the medicines you were taking before surgery. Other medications you will need are pain medications and possibly constipation and nausea medications (Zofran).  ° °

## 2018-04-03 NOTE — Op Note (Addendum)
Carol Espinoza PROCEDURE DATE: 04/03/2018  PREOPERATIVE DIAGNOSIS:  POSTOPERATIVE DIAGNOSIS: The same PROCEDURE: Total laparoscopic hysterectomy, bilateral salpingectomy, right oophorectomy, cystoscopy, lysis of adhesions SURGEON:  Dr. Benjaman Kindler ASSISTANT: Dr. Laverta Baltimore Anesthesiologist:  Anesthesiologist: Alvin Critchley, MD CRNA: Nile Riggs, CRNA; Johnna Acosta, CRNA  INDICATIONS: 57 y.o. F here for definitive surgical management secondary to the indications listed under preoperative diagnoses; please see preoperative note for further details.  Risks of surgery were discussed with the patient including but not limited to: bleeding which may require transfusion or reoperation; infection which may require antibiotics; injury to bowel, bladder, ureters or other surrounding organs; need for additional procedures; thromboembolic phenomenon, incisional problems and other postoperative/anesthesia complications. Written informed consent was obtained.    FINDINGS:  Normal vagina and perineum. Normal upper abdomen. Uterus with a large subserosal fundal fibroid on left and multiple small fibroids on right. Normal left ovary, small cystic right ovary. Bilateral patent ureteral openings and normal bladder. Filmy adhesions in RLQ and moderate adhesions between bladder and lower uterine segment.   We backfilled the bladder for safety, and removed Foley at the end of the case.  ANESTHESIA:    General INTRAVENOUS FLUIDS:1100  ml ESTIMATED BLOOD LOSS:20 ml URINE OUTPUT: 300 ml   SPECIMENS: Uterus, cervix, bilateral fallopian tubes and right ovary COMPLICATIONS: None immediate  PROCEDURE IN DETAIL:  The patient received prophalactic intravenous antibiotics and had sequential compression devices applied to her lower extremities while in the preoperative area.  She was then taken to the operating room where general anesthesia was administered and was found to be adequate.  She was  placed in the dorsal lithotomy position, and was prepped and draped in a sterile manner.  A formal time out was performed with all team members present and in agreement.  A V-care uterine manipulator was placed at this time.  A Foley catheter was inserted into her bladder and attached to constant drainage. Attention was turned to the abdomen and 0.5% Marcaine infused subq. A 71mm umbilical incision was made with the scalpel.  The Optiview 5-mm trocar and sleeve were then advanced without difficulty with the laparoscope under direct visualization into the abdomen.  The abdomen was then insufflated with carbon dioxide gas and adequate pneumoperitoneum was obtained.  A survey of the patient's pelvis and abdomen revealed the findings above.  Bilateral lower quadrant ports (5 mm on the right and 11 mm on the left) were then placed under direct visualization.  The pelvis was then carefully examined.  Attention was turned to the fallopian tubes; these were freed from the underlying mesosalpinx and the uterine attachments using the Ligasure device. On the right, the IP was doubly clamped and cut, after review of the course of the peristalsing ureter. The bilateral round and broad ligaments were then clamped and transected with the Ligasure device.  The uterine artery was then skeletonized and a bladder flap was created.  The ureters were noted to be safely away from the area of dissection. After backfilling the bladder, the bladder was then bluntly dissected off the lower uterine segment.    At this point, attention was turned to the uterine vessels, which were clamped and cauterized using the Ligasure on the left, and then the right. After the uterine blood flow at the level of the internal os was controlled, both arteries were cut with the Ligasure.  Good hemostasis was noted overall.  The uterosacral and cardinal ligaments were clamped, cut and ligated bilaterally .  Attention  was then turned to the cervicovaginal  junction, and monopolar scissors were used to transect the cervix from the surrounding vagina using the ring of the V-care as a guide. This was done circumferentially allowing total hysterectomy.  The uterus was then removed from the vagina and the vaginal cuff incision was then closed with running V-loc suture.  Overall excellent hemostasis was noted.    Attention was returned to the abdomen.The ureters were reexamined bilaterally and were pulsating normally. The abdominal pressure was reduced and hemostasis was confirmed.   Intravenous floruoceine was administered, and cystoscopy showed bilateral ureteral jets.  No stitches were visualized in the bladder during cystoscopy.  The 25mm port fascia was closed with a vertical mattress with 0-Vicryl, using the cone closure system. All trocars were removed under direct visualization, and the abdomen was desufflated.  All skin incisions were closed with 4-0 Vicryl subcuticular stitches and Dermabond. The patient tolerated the procedures well.  All instruments, needles, and sponge counts were correct x 2. The patient was taken to the recovery room awake, extubated and in stable condition.

## 2018-04-03 NOTE — Transfer of Care (Signed)
Immediate Anesthesia Transfer of Care Note  Patient: Carol Espinoza  Procedure(s) Performed: HYSTERECTOMY TOTAL LAPAROSCOPIC (N/A ) LAPAROSCOPIC UNILATERAL SALPINGECTOMY (Left ) LAPAROSCOPIC UNILATERAL SALPINGO OOPHORECTOMY (Right ) CYSTOSCOPY  Patient Location: PACU  Anesthesia Type:General  Level of Consciousness: drowsy and patient cooperative  Airway & Oxygen Therapy: Patient Spontanous Breathing and Patient connected to face mask oxygen  Post-op Assessment: Report given to RN, Post -op Vital signs reviewed and stable and Patient moving all extremities  Post vital signs: Reviewed and stable  Last Vitals:  Vitals Value Taken Time  BP 112/72 04/03/2018 10:20 AM  Temp    Pulse 94 04/03/2018 10:23 AM  Resp 11 04/03/2018 10:23 AM  SpO2 100 % 04/03/2018 10:23 AM  Vitals shown include unvalidated device data.  Last Pain:  Vitals:   04/03/18 1006  TempSrc: Temporal  PainSc: 0-No pain         Complications: No apparent anesthesia complications
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
## 2018-04-03 NOTE — Progress Notes (Signed)
Romazicon given per order

## 2018-04-03 NOTE — Interval H&P Note (Signed)
History and Physical Interval Note:  04/03/2018 7:47 AM  Carol Espinoza  has presented today for surgery, with the diagnosis of PMB, Fibroids, Complex ovarian cyst  The various methods of treatment have been discussed with the patient and family. After consideration of risks, benefits and other options for treatment, the patient has consented to  Procedure(s): HYSTERECTOMY TOTAL LAPAROSCOPIC (N/A) LAPAROSCOPIC BILATERAL SALPINGECTOMY (Bilateral) LAPAROSCOPIC UNILATERAL SALPINGO OOPHORECTOMY (Right) as a surgical intervention .  The patient's history has been reviewed, patient examined, no change in status, stable for surgery.  I have reviewed the patient's chart and labs.  Questions were answered to the patient's satisfaction.     Benjaman Kindler

## 2018-04-03 NOTE — Anesthesia Post-op Follow-up Note (Signed)
Anesthesia QCDR form completed.        

## 2018-04-03 NOTE — Progress Notes (Signed)
Pt vomitted clear liquid  Feels better   sats better

## 2018-04-04 ENCOUNTER — Encounter: Payer: Self-pay | Admitting: Obstetrics and Gynecology

## 2018-04-04 DIAGNOSIS — N920 Excessive and frequent menstruation with regular cycle: Secondary | ICD-10-CM | POA: Diagnosis not present

## 2018-04-04 LAB — BASIC METABOLIC PANEL
Anion gap: 5 (ref 5–15)
BUN: 11 mg/dL (ref 6–20)
CALCIUM: 8.5 mg/dL — AB (ref 8.9–10.3)
CO2: 25 mmol/L (ref 22–32)
Chloride: 101 mmol/L (ref 98–111)
Creatinine, Ser: 0.51 mg/dL (ref 0.44–1.00)
GFR calc Af Amer: >60 mL/min (ref >60)
GFR calc non Af Amer: >60 mL/min (ref >60)
GLUCOSE: 126 mg/dL — AB (ref 70–99)
Potassium: 4 mmol/L (ref 3.5–5.1)
Sodium: 131 mmol/L — ABNORMAL LOW (ref 135–145)

## 2018-04-04 LAB — CBC
HEMATOCRIT: 37.3 % (ref 35.0–47.0)
Hemoglobin: 12.6 g/dL (ref 12.0–16.0)
MCH: 28.7 pg (ref 26.0–34.0)
MCHC: 33.7 g/dL (ref 32.0–36.0)
MCV: 85 fL (ref 80.0–100.0)
Platelets: 199 10*3/uL (ref 150–440)
RBC: 4.39 MIL/uL (ref 3.80–5.20)
RDW: 12.7 % (ref 11.5–14.5)
WBC: 13.1 10*3/uL — ABNORMAL HIGH (ref 3.6–11.0)

## 2018-04-04 NOTE — Progress Notes (Signed)
Patient taken off .5L oxygen this morning; has been on room air since 0840. O2 between 97%-100%. Vitals stable and WDL.  No flatus yet but patient is belching. Bleeding scant. Tolerating PO fluids/foods well. Bowel sounds hypoactive in both lower quadrants, active in upper quadrants. Patient complains of discomfort in upper abdomen but states it could be the gas. PRN simethicone given. Patient taking tylenol and offered oxycodone for pain. Patient declines oxy at this time. Patient ambulated in hall. Tolerated well.

## 2018-04-04 NOTE — Progress Notes (Signed)
Discharge instructions complete and prescriptions given. Patient verbalizes understanding of teaching. Patient discharged home via wheelchair at 1745.

## 2018-04-04 NOTE — Discharge Summary (Signed)
1 Day Post-Op       Procedure(s): HYSTERECTOMY TOTAL LAPAROSCOPIC (N/A) LAPAROSCOPIC UNILATERAL SALPINGECTOMY (Left) LAPAROSCOPIC UNILATERAL SALPINGO OOPHORECTOMY (Right) CYSTOSCOPY Subjective: The patient is doing well.  No nausea or vomiting. Pain is adequately controlled. Tolerating regular diet  Objective: Vital signs in last 24 hours: Temp:  [97.9 F (36.6 C)-98.5 F (36.9 C)] 98 F (36.7 C) (06/25 1148) Pulse Rate:  [59-93] 88 (06/25 1148) Resp:  [9-21] 18 (06/25 1148) BP: (84-114)/(49-86) 102/62 (06/25 1148) SpO2:  [88 %-100 %] 99 % (06/25 0852)  Intake/Output  Intake/Output Summary (Last 24 hours) at 04/04/2018 1334 Last data filed at 04/04/2018 1030 Gross per 24 hour  Intake 2252.5 ml  Output 1875 ml  Net 377.5 ml    Physical Exam:  General: Alert and oriented. CV: RRR Lungs: Clear bilaterally. GI: Soft, Nondistended. Incisions: Clean and dry. Urine: Clear, Foley in place Extremities: Nontender, no erythema, no edema.  Lab Results: Recent Labs    04/04/18 0455  HGB 12.6  HCT 37.3  WBC 13.1*  PLT 199                 Results for orders placed or performed during the hospital encounter of 04/03/18 (from the past 24 hour(s))  CBC     Status: Abnormal   Collection Time: 04/04/18  4:55 AM  Result Value Ref Range   WBC 13.1 (H) 3.6 - 11.0 K/uL   RBC 4.39 3.80 - 5.20 MIL/uL   Hemoglobin 12.6 12.0 - 16.0 g/dL   HCT 37.3 35.0 - 47.0 %   MCV 85.0 80.0 - 100.0 fL   MCH 28.7 26.0 - 34.0 pg   MCHC 33.7 32.0 - 36.0 g/dL   RDW 12.7 11.5 - 14.5 %   Platelets 199 150 - 440 K/uL  Basic metabolic panel     Status: Abnormal   Collection Time: 04/04/18  4:55 AM  Result Value Ref Range   Sodium 131 (L) 135 - 145 mmol/L   Potassium 4.0 3.5 - 5.1 mmol/L   Chloride 101 98 - 111 mmol/L   CO2 25 22 - 32 mmol/L   Glucose, Bld 126 (H) 70 - 99 mg/dL   BUN 11 6 - 20 mg/dL   Creatinine, Ser 0.51 0.44 - 1.00 mg/dL   Calcium 8.5 (L) 8.9 - 10.3 mg/dL   GFR calc non Af  Amer >60 >60 mL/min   GFR calc Af Amer >60 >60 mL/min   Anion gap 5 5 - 15    Assessment/Plan: 1 Day Post-Op       Procedure(s): HYSTERECTOMY TOTAL LAPAROSCOPIC (N/A) LAPAROSCOPIC UNILATERAL SALPINGECTOMY (Left) LAPAROSCOPIC UNILATERAL SALPINGO OOPHORECTOMY (Right) CYSTOSCOPY  1) Ambulate, Incentive spirometry 2) Advance diet as tolerated 3) Transition to oral pain medication 4) Discharge home today anticipated    Benjaman Kindler, MD   LOS: 0 days   Benjaman Kindler 04/04/2018, 1:34 PM

## 2018-04-04 NOTE — Anesthesia Postprocedure Evaluation (Signed)
Anesthesia Post Note  Patient: Carol Espinoza  Procedure(s) Performed: HYSTERECTOMY TOTAL LAPAROSCOPIC (N/A ) LAPAROSCOPIC UNILATERAL SALPINGECTOMY (Left ) LAPAROSCOPIC UNILATERAL SALPINGO OOPHORECTOMY (Right ) CYSTOSCOPY  Patient location during evaluation: PACU Anesthesia Type: General Level of consciousness: awake and alert and oriented Pain management: pain level controlled Vital Signs Assessment: post-procedure vital signs reviewed and stable Respiratory status: spontaneous breathing Cardiovascular status: blood pressure returned to baseline Anesthetic complications: no     Last Vitals:  Vitals:   04/04/18 0323 04/04/18 0852  BP: (!) 93/53 106/65  Pulse: 92 72  Resp: 18 20  Temp: 36.6 C 36.8 C  SpO2: 98% 99%    Last Pain:  Vitals:   04/04/18 0852  TempSrc: Oral  PainSc:                  Karlene Southard

## 2018-04-05 LAB — SURGICAL PATHOLOGY

## 2018-05-23 ENCOUNTER — Other Ambulatory Visit: Payer: Self-pay | Admitting: Obstetrics and Gynecology

## 2018-05-23 DIAGNOSIS — Z1231 Encounter for screening mammogram for malignant neoplasm of breast: Secondary | ICD-10-CM

## 2018-06-29 ENCOUNTER — Ambulatory Visit
Admission: RE | Admit: 2018-06-29 | Discharge: 2018-06-29 | Disposition: A | Discharge status: Home / Self Care | Payer: Managed Care, Other (non HMO) | Source: Ambulatory Visit | Attending: Obstetrics and Gynecology | Admitting: Obstetrics and Gynecology

## 2018-06-29 DIAGNOSIS — Z1231 Encounter for screening mammogram for malignant neoplasm of breast: Secondary | ICD-10-CM | POA: Diagnosis present

## 2018-10-17 ENCOUNTER — Other Ambulatory Visit: Payer: Self-pay | Admitting: Gastroenterology

## 2018-11-14 ENCOUNTER — Other Ambulatory Visit: Payer: Self-pay | Admitting: Gastroenterology

## 2018-12-13 ENCOUNTER — Other Ambulatory Visit: Payer: Self-pay | Admitting: Orthopedic Surgery

## 2018-12-13 DIAGNOSIS — M545 Low back pain, unspecified: Secondary | ICD-10-CM

## 2018-12-21 ENCOUNTER — Ambulatory Visit
Admission: RE | Admit: 2018-12-21 | Discharge: 2018-12-21 | Disposition: A | Discharge status: Home / Self Care | Payer: Managed Care, Other (non HMO) | Source: Ambulatory Visit | Attending: Orthopedic Surgery | Admitting: Orthopedic Surgery

## 2018-12-21 ENCOUNTER — Other Ambulatory Visit: Payer: Self-pay

## 2018-12-21 DIAGNOSIS — M545 Low back pain, unspecified: Secondary | ICD-10-CM

## 2019-01-14 ENCOUNTER — Other Ambulatory Visit: Payer: Self-pay | Admitting: Gastroenterology

## 2019-02-09 ENCOUNTER — Other Ambulatory Visit: Payer: Self-pay | Admitting: Gastroenterology

## 2019-06-13 ENCOUNTER — Ambulatory Visit: Payer: 59 | Attending: Obstetrics and Gynecology | Admitting: Physical Therapy

## 2019-06-13 ENCOUNTER — Other Ambulatory Visit: Payer: Self-pay

## 2019-06-13 ENCOUNTER — Encounter: Payer: Self-pay | Admitting: Physical Therapy

## 2019-06-13 DIAGNOSIS — R293 Abnormal posture: Secondary | ICD-10-CM | POA: Insufficient documentation

## 2019-06-13 DIAGNOSIS — M62838 Other muscle spasm: Secondary | ICD-10-CM | POA: Diagnosis present

## 2019-06-13 DIAGNOSIS — R102 Pelvic and perineal pain: Secondary | ICD-10-CM | POA: Diagnosis present
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
## 2019-06-20 ENCOUNTER — Ambulatory Visit: Payer: 59 | Admitting: Physical Therapy

## 2019-06-20 ENCOUNTER — Other Ambulatory Visit: Payer: Self-pay

## 2019-06-20 DIAGNOSIS — R293 Abnormal posture: Secondary | ICD-10-CM | POA: Diagnosis not present

## 2019-06-20 DIAGNOSIS — M62838 Other muscle spasm: Secondary | ICD-10-CM

## 2019-06-20 DIAGNOSIS — R102 Pelvic and perineal pain unspecified side: Secondary | ICD-10-CM

## 2019-06-21 ENCOUNTER — Encounter: Payer: Self-pay | Admitting: Physical Therapy
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
## 2019-06-27 ENCOUNTER — Encounter: Payer: 59 | Admitting: Physical Therapy

## 2019-06-28 ENCOUNTER — Ambulatory Visit: Payer: 59 | Admitting: Physical Therapy

## 2019-06-28 ENCOUNTER — Encounter: Payer: Self-pay | Admitting: Physical Therapy

## 2019-06-28 ENCOUNTER — Other Ambulatory Visit: Payer: Self-pay

## 2019-06-28 DIAGNOSIS — M62838 Other muscle spasm: Secondary | ICD-10-CM

## 2019-06-28 DIAGNOSIS — R293 Abnormal posture: Secondary | ICD-10-CM

## 2019-06-28 DIAGNOSIS — R102 Pelvic and perineal pain: Secondary | ICD-10-CM
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
## 2019-07-04 ENCOUNTER — Ambulatory Visit: Payer: 59 | Admitting: Physical Therapy

## 2019-07-04 ENCOUNTER — Other Ambulatory Visit: Payer: Self-pay

## 2019-07-04 ENCOUNTER — Encounter: Payer: Self-pay | Admitting: Physical Therapy

## 2019-07-04 DIAGNOSIS — R102 Pelvic and perineal pain unspecified side: Secondary | ICD-10-CM

## 2019-07-04 DIAGNOSIS — R293 Abnormal posture: Secondary | ICD-10-CM

## 2019-07-04 DIAGNOSIS — M62838 Other muscle spasm: Secondary | ICD-10-CM
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
## 2019-07-11 ENCOUNTER — Encounter: Payer: Self-pay | Admitting: Physical Therapy

## 2019-07-11 ENCOUNTER — Ambulatory Visit: Payer: 59 | Admitting: Physical Therapy

## 2019-07-11 ENCOUNTER — Other Ambulatory Visit: Payer: Self-pay

## 2019-07-11 DIAGNOSIS — R102 Pelvic and perineal pain unspecified side: Secondary | ICD-10-CM

## 2019-07-11 DIAGNOSIS — R293 Abnormal posture: Secondary | ICD-10-CM | POA: Diagnosis not present

## 2019-07-11 DIAGNOSIS — M62838 Other muscle spasm: Secondary | ICD-10-CM
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
## 2019-07-18 ENCOUNTER — Encounter: Payer: Self-pay | Admitting: Physical Therapy

## 2019-07-18 ENCOUNTER — Other Ambulatory Visit: Payer: Self-pay | Admitting: Obstetrics and Gynecology

## 2019-07-18 ENCOUNTER — Ambulatory Visit: Payer: 59 | Attending: Obstetrics and Gynecology | Admitting: Physical Therapy

## 2019-07-18 ENCOUNTER — Other Ambulatory Visit: Payer: Self-pay

## 2019-07-18 DIAGNOSIS — R293 Abnormal posture: Secondary | ICD-10-CM | POA: Insufficient documentation

## 2019-07-18 DIAGNOSIS — Z1231 Encounter for screening mammogram for malignant neoplasm of breast: Secondary | ICD-10-CM

## 2019-07-18 DIAGNOSIS — M62838 Other muscle spasm: Secondary | ICD-10-CM | POA: Insufficient documentation

## 2019-07-18 DIAGNOSIS — R102 Pelvic and perineal pain: Secondary | ICD-10-CM | POA: Diagnosis not present
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
## 2019-07-25 ENCOUNTER — Ambulatory Visit: Payer: 59 | Admitting: Physical Therapy

## 2019-07-25 ENCOUNTER — Other Ambulatory Visit: Payer: Self-pay

## 2019-07-25 ENCOUNTER — Encounter: Payer: Self-pay | Admitting: Physical Therapy

## 2019-07-25 DIAGNOSIS — R293 Abnormal posture: Secondary | ICD-10-CM

## 2019-07-25 DIAGNOSIS — M62838 Other muscle spasm: Secondary | ICD-10-CM

## 2019-07-25 DIAGNOSIS — R102 Pelvic and perineal pain: Secondary | ICD-10-CM
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
## 2019-08-01 ENCOUNTER — Ambulatory Visit: Payer: 59 | Admitting: Physical Therapy

## 2019-08-01 ENCOUNTER — Encounter: Payer: Self-pay | Admitting: Physical Therapy

## 2019-08-01 ENCOUNTER — Other Ambulatory Visit: Payer: Self-pay

## 2019-08-01 DIAGNOSIS — R293 Abnormal posture: Secondary | ICD-10-CM

## 2019-08-01 DIAGNOSIS — M62838 Other muscle spasm: Secondary | ICD-10-CM

## 2019-08-01 DIAGNOSIS — R102 Pelvic and perineal pain: Secondary | ICD-10-CM
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
## 2019-08-07 ENCOUNTER — Ambulatory Visit
Admission: RE | Admit: 2019-08-07 | Discharge: 2019-08-07 | Disposition: A | Discharge status: Home / Self Care | Payer: 59 | Source: Ambulatory Visit | Attending: Obstetrics and Gynecology | Admitting: Obstetrics and Gynecology

## 2019-08-07 ENCOUNTER — Other Ambulatory Visit: Payer: Self-pay

## 2019-08-07 DIAGNOSIS — Z1231 Encounter for screening mammogram for malignant neoplasm of breast: Secondary | ICD-10-CM | POA: Insufficient documentation

## 2019-08-08 ENCOUNTER — Encounter: Payer: Self-pay | Admitting: Physical Therapy

## 2019-08-08 ENCOUNTER — Ambulatory Visit: Payer: 59 | Admitting: Physical Therapy

## 2019-08-08 DIAGNOSIS — R293 Abnormal posture: Secondary | ICD-10-CM

## 2019-08-08 DIAGNOSIS — R102 Pelvic and perineal pain: Secondary | ICD-10-CM

## 2019-08-08 DIAGNOSIS — M62838 Other muscle spasm: Secondary | ICD-10-CM
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
## 2019-08-10 ENCOUNTER — Other Ambulatory Visit: Payer: Self-pay | Admitting: Obstetrics and Gynecology

## 2019-08-10 DIAGNOSIS — N6310 Unspecified lump in the right breast, unspecified quadrant: Secondary | ICD-10-CM

## 2019-08-10 DIAGNOSIS — R928 Other abnormal and inconclusive findings on diagnostic imaging of breast: Secondary | ICD-10-CM

## 2019-08-20 ENCOUNTER — Ambulatory Visit
Admission: RE | Admit: 2019-08-20 | Discharge: 2019-08-20 | Disposition: A | Discharge status: Home / Self Care | Payer: 59 | Source: Ambulatory Visit | Attending: Obstetrics and Gynecology | Admitting: Obstetrics and Gynecology

## 2019-08-20 DIAGNOSIS — N6310 Unspecified lump in the right breast, unspecified quadrant: Secondary | ICD-10-CM | POA: Diagnosis present

## 2019-08-20 DIAGNOSIS — R928 Other abnormal and inconclusive findings on diagnostic imaging of breast: Secondary | ICD-10-CM

## 2019-08-22 ENCOUNTER — Encounter: Payer: Self-pay | Admitting: Physical Therapy

## 2019-08-22 ENCOUNTER — Other Ambulatory Visit: Payer: Self-pay | Admitting: Obstetrics and Gynecology

## 2019-08-22 DIAGNOSIS — N6310 Unspecified lump in the right breast, unspecified quadrant: Secondary | ICD-10-CM

## 2019-08-22 DIAGNOSIS — R928 Other abnormal and inconclusive findings on diagnostic imaging of breast: Secondary | ICD-10-CM

## 2019-08-24 ENCOUNTER — Ambulatory Visit
Admission: RE | Admit: 2019-08-24 | Discharge: 2019-08-24 | Disposition: A | Discharge status: Home / Self Care | Payer: 59 | Source: Ambulatory Visit | Attending: Obstetrics and Gynecology | Admitting: Obstetrics and Gynecology

## 2019-08-24 DIAGNOSIS — N6310 Unspecified lump in the right breast, unspecified quadrant: Secondary | ICD-10-CM

## 2019-08-24 DIAGNOSIS — R928 Other abnormal and inconclusive findings on diagnostic imaging of breast: Secondary | ICD-10-CM | POA: Insufficient documentation

## 2019-08-24 HISTORY — PX: BREAST BIOPSY: SHX20

## 2019-08-27 ENCOUNTER — Encounter: Payer: Self-pay | Admitting: *Deleted

## 2019-08-27 ENCOUNTER — Other Ambulatory Visit: Payer: Self-pay | Admitting: Anatomic Pathology & Clinical Pathology

## 2019-08-27 DIAGNOSIS — C50911 Malignant neoplasm of unspecified site of right female breast: Secondary | ICD-10-CM

## 2019-08-27 NOTE — Progress Notes (Signed)
Called patient today to establish navigation services.  She is newly diagnosed with invasive mammary carcinoma. 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 States her friend has recommended that she see Dr. Hampton Abbot and Dr. Grayland Ormond.  I have scheduled her to see Dr. Hampton Abbot on 08/29/19 @ 11:00.  She is to take a photo ID, meds and wear a mask.  Will give her educational literature when she see Dr. Grayland Ormond.  Referral placed to schedule appointment with him.  She is to call with any questions or needs.

## 2019-08-28 ENCOUNTER — Encounter: Payer: Self-pay | Admitting: *Deleted

## 2019-08-28 NOTE — Progress Notes (Signed)
Called and informed patient of her appointment to see Dr. Grayland Ormond on Friday 08/31/19 @ 1:30.  Informed patient Al Pimple, RN will meet her at her appointment.  She is to call with any questions or needs.

## 2019-08-29 ENCOUNTER — Other Ambulatory Visit: Payer: Self-pay | Admitting: Anatomic Pathology & Clinical Pathology

## 2019-08-29 ENCOUNTER — Encounter: Payer: Self-pay | Admitting: Physical Therapy

## 2019-08-29 ENCOUNTER — Other Ambulatory Visit: Payer: Self-pay

## 2019-08-29 ENCOUNTER — Ambulatory Visit: Payer: 59 | Admitting: Surgery

## 2019-08-29 ENCOUNTER — Encounter: Payer: Self-pay | Admitting: Surgery

## 2019-08-29 VITALS — BP 125/79 | HR 109 | Ht 62.0 in | Wt 155.0 lb

## 2019-08-29 DIAGNOSIS — C50211 Malignant neoplasm of upper-inner quadrant of right female breast: Secondary | ICD-10-CM | POA: Diagnosis not present

## 2019-08-29 DIAGNOSIS — Z170 Estrogen receptor positive status [ER+]: Secondary | ICD-10-CM | POA: Diagnosis not present

## 2019-08-29 LAB — SURGICAL PATHOLOGY
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
## 2019-08-29 NOTE — Patient Instructions (Signed)
Lumpectomy  A lumpectomy, sometimes called a partial mastectomy, is surgery to remove a cancerous tumor or mass (the lump) from a breast. It is a form of "breast conserving" or "breast preservation" surgery. This means that the cancerous tissue is removed but the breast remains intact. During a lumpectomy, the portion of the breast that contains the tumor is removed. Some normal tissue around the lump may be taken out to make sure that all of the tumor has been removed. Lymph nodes under your arm may also be removed and tested to find out if the cancer has spread. Lymph nodes are part of the body's disease-fighting system (immune system) and are usually the first place where breast cancer spreads. Tell a health care provider about:  Any allergies you have.  All medicines you are taking, including vitamins, herbs, eye drops, creams, and over-the-counter medicines.  Any problems you or family members have had with anesthetic medicines.  Any blood disorders you have.  Any surgeries you have had.  Any medical conditions you have.  Whether you are pregnant or may be pregnant. What are the risks? Generally, this is a safe procedure. However, problems may occur, including:  Bleeding.  Infection.  Allergic reaction to medicines.  Pain, swelling, weakness, or numbness in the arm on the side of your surgery.  Temporary swelling.  Change in the shape of the breast, particularly if a large portion is removed.  Scar tissue that forms at the surgical site and feels hard to the touch. What happens before the procedure? Staying hydrated Follow instructions from your health care provider about hydration, which may include:  Up to 2 hours before the procedure - you may continue to drink clear liquids, such as water, clear fruit juice, black coffee, and plain tea.  Eating and drinking restrictions  Follow instructions from your health care provider about eating and drinking, which may  include: ? 8 hours before the procedure - stop eating heavy meals or foods such as meat, fried foods, or fatty foods. ? 6 hours before the procedure - stop eating light meals or foods, such as toast or cereal. ? 6 hours before the procedure - stop drinking milk or drinks that contain milk. ? 2 hours before the procedure - stop drinking clear liquids. Medicines  Ask your health care provider about: ? Changing or stopping your regular medicines. This is especially important if you are taking diabetes medicines or blood thinners. ? Taking medicines such as aspirin and ibuprofen. These medicines can thin your blood. Do not take these medicines before your procedure if your health care provider instructs you not to.  You may be given antibiotic medicine to help prevent infection. General instructions  Prior to surgery, your health care provider may do a procedure to locate and mark the tumor area in your breast (localization). This procedure will help guide your surgeon to where the incision will be made. This may be done with: ? Imaging. This may include a mammogram, ultrasound, or MRI. ? Insertion of a small wire, clip, seed, or radar reflector implant.  You may be screened for extra fluid around the lymph nodes (lymphedema).  Ask your health care provider how your surgical site will be marked or identified.  Plan to have someone take you home from the hospital or clinic. What happens during the procedure?   To lower your risk of infection: ? Your health care team will wash or sanitize their hands. ? Your skin will be washed with soap.    An IV will be inserted into one of your veins.  You will be given one or more of the following: ? A medicine to help you relax (sedative). ? A medicine to numb the area (local anesthetic). ? A medicine to make you fall asleep (general anesthetic).  Your health care provider will use a kind of electric scalpel that uses heat to minimize bleeding  (electrocautery knife). A curved incision that follows the natural curve of your breast will be made. This type of incision will allow for minimal scarring and better healing.  The tumor will be removed along with some of the surrounding tissue. This will be sent to the lab for testing. Your health care provider may also remove lymph nodes at this time if needed.  If the tumor is close to the muscles over your chest, some muscle tissue may also be removed.  A small drain tube may be inserted into your breast area or armpit to collect fluid that may build up after surgery. This tube will be connected to a suction bulb on the outside of your body to remove the fluid.  The incision will be closed with stitches (sutures).  A bandage (dressing) may be placed over the incision. The procedure may vary among health care providers and hospitals. What happens after the procedure?  Your blood pressure, heart rate, breathing rate, and blood oxygen level will be monitored until you leave the hospital or clinic.  You will be given medicine for pain as needed.  Your IV will be removed when you are able to eat and drink by mouth.  You will be encouraged to get up and walk as soon as you can. This is important to improve blood flow and breathing. Ask for help if you feel weak or unsteady.  You may have a drain tube in place for 2-3 days to prevent a collection of blood (hematoma) from developing in the breast. You will be given instructions about caring for the drain before you go home.  A pressure bandage may be applied for 1-2 days to prevent bleeding or swelling. Your pressure bandage may look like a thick piece of fabric or an elastic wrap. Ask your health care provider how to care for your bandage at home.  You may be given a tight sleeve to wear over your arm on the side of your surgery. You should wear this sleeve as told by your health care provider.  Do not drive for 24 hours if you were given a  sedative during your procedure. Summary  A lumpectomy, sometimes called a partial mastectomy, is surgery to remove a cancerous tumor or mass (the lump) from a breast.  During a lumpectomy, the portion of the breast that contains the tumor is removed. Some normal tissue around the lump may be taken out to make sure that all of the tumor has been removed. Lymph nodes under your arm may also be removed and tested to find out if the cancer has spread.  You may have a drain tube in place for 2-3 days to prevent a collection of blood (hematoma) from developing in the breast. You will be given instructions about caring for the drain before you go home.  Plan to have someone take you home from the hospital or clinic. This information is not intended to replace advice given to you by your health care provider. Make sure you discuss any questions you have with your health care provider. Document Released: 11/08/2006 Document Revised: 03/28/2018   Document Reviewed: 06/09/2016 Elsevier Patient Education  2020 [employer]  
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
## 2019-08-30 ENCOUNTER — Telehealth: Payer: Self-pay | Admitting: Surgery

## 2019-08-30 NOTE — Telephone Encounter (Signed)
Please call radiology and schedule an Axillary SN injection. Inform them that the Sx is scheduled for 09/12/19 and the start time is 12:30. Once it is scheduled they will advise you of what time the patient will need to arrive for surgery. The patient will not need to call the day prior to sx to find out their arrival time.   Once you have the information above, please call the patient below and inform her of her sx information.   Surgery Date: 09/12/19 with Dr Orest Dikes lumpectomy and right axillary SN injection. Preadmission Testing Date: 09/05/19 @ 1:45pm-office interview. Please advise patient that she will need to arrive at the medical mall and will be escorted to the preadmission office.  Covid Testing Date: 09/03/19 between 8-10:30am - patient advised to go to the [facility] ([place])  [employer] Video sent via [employer] [facility] and [employer].  Please make patient aware to call [phone], between 1-3:00pm the day before surgery, to find out what time to arrive.

## 2019-08-31 ENCOUNTER — Other Ambulatory Visit: Payer: Self-pay | Admitting: Surgery

## 2019-08-31 ENCOUNTER — Other Ambulatory Visit: Payer: Self-pay

## 2019-08-31 ENCOUNTER — Encounter: Payer: Self-pay | Admitting: Oncology

## 2019-08-31 ENCOUNTER — Inpatient Hospital Stay: Payer: 59 | Attending: Oncology | Admitting: Oncology

## 2019-08-31 VITALS — BP 128/61 | HR 89 | Temp 98.0°F | Wt 158.4 lb

## 2019-08-31 DIAGNOSIS — Z170 Estrogen receptor positive status [ER+]: Secondary | ICD-10-CM | POA: Insufficient documentation

## 2019-08-31 DIAGNOSIS — E785 Hyperlipidemia, unspecified: Secondary | ICD-10-CM | POA: Insufficient documentation

## 2019-08-31 DIAGNOSIS — C50211 Malignant neoplasm of upper-inner quadrant of right female breast: Secondary | ICD-10-CM | POA: Insufficient documentation

## 2019-08-31 DIAGNOSIS — K219 Gastro-esophageal reflux disease without esophagitis: Secondary | ICD-10-CM | POA: Diagnosis not present

## 2019-08-31 DIAGNOSIS — C50911 Malignant neoplasm of unspecified site of right female breast: Secondary | ICD-10-CM

## 2019-08-31 DIAGNOSIS — F329 Major depressive disorder, single episode, unspecified: Secondary | ICD-10-CM | POA: Insufficient documentation

## 2019-08-31 DIAGNOSIS — Z79899 Other long term (current) drug therapy: Secondary | ICD-10-CM | POA: Diagnosis not present
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
## 2019-08-31 NOTE — Research (Signed)
I met with patient todayafterherappointment with Dr.Finnegantoreviewstudy "Procurement of Human Biospecimens for the Discovery and Validation of Biomarkers for the Predication, Diagnosis and Management of Disease" sponsored by [employer]. Patient's husband was also present during conversation.Dr. Grayland Ormond introduced study to patient.  Ifurther explained the study to patient including purpose of the study, risks/benefits, participation requirements, voluntary participation and reimbursement provided by the study. Patient understands that if she is agreeable to participate, she will have to undergo an additional venipuncture that is not a part of her planned care.  Patient understands and agreeable to participation. Patient signed consent and HIPPA for study, both IRB approved 04/03/2019. Copy of signed forms given to patient. Per study guidelines, patient was asked ifshe had Novocain in the past week or had any history of cancer. Patient answered no to both of these questions. (If the patient were to have answered yes, she would be disqualified from study). Request made with scheduling to schedule lab only appointment for Nov 30 around 9:00 am.  Blood for study will be collected at that time.   Lula Olszewski Clinical Oncology research associate 08/31/2019 1453  Patient presented today to give blood for study participation.  Blood collected and to be sent to [facility] today.  Patient was given gift card provided by [facility].  I thanked patient for her participation.   Lula Olszewski Clinical Oncology research associate 09/10/2019 09:37

## 2019-08-31 NOTE — Telephone Encounter (Signed)
Patient aware to arrive at 8:30 for Axillary SN injection/stero. Patient aware of surgery information

## 2019-08-31 NOTE — Progress Notes (Signed)
Supported patient and her husband at initial Med/Onc visit with Dr. Grayland Ormond.  Given Breast Cancer Treatment Handbook/folder with hospital services. Plans for lumpectomy on September 12, 2019.  Follow-up with Dr. Grayland Ormond and consult with Dr. Baruch Gouty on 09/26/2019. Per Dr. Grayland Ormond Oncotype DX results will be discussed at that visit. Patient and husband celebrating 72 year anniversary.

## 2019-09-03 ENCOUNTER — Other Ambulatory Visit: Payer: 59

## 2019-09-03 ENCOUNTER — Inpatient Hospital Stay: Payer: 59

## 2019-09-04 ENCOUNTER — Encounter: Payer: Self-pay | Admitting: Physical Therapy

## 2019-09-05 ENCOUNTER — Other Ambulatory Visit: Payer: Self-pay

## 2019-09-05 ENCOUNTER — Encounter
Admission: RE | Admit: 2019-09-05 | Discharge: 2019-09-05 | Disposition: A | Discharge status: Home / Self Care | Payer: 59 | Source: Ambulatory Visit | Attending: Surgery | Admitting: Surgery

## 2019-09-05 ENCOUNTER — Encounter: Payer: Self-pay | Admitting: Physical Therapy

## 2019-09-05 DIAGNOSIS — Z01812 Encounter for preprocedural laboratory examination: Secondary | ICD-10-CM | POA: Diagnosis not present

## 2019-09-05 LAB — CBC WITH DIFFERENTIAL/PLATELET
Abs Immature Granulocytes: 0.01 10*3/uL (ref 0.00–0.07)
Basophils Absolute: 0.1 10*3/uL (ref 0.0–0.1)
Basophils Relative: 1 %
Eosinophils Absolute: 0.2 10*3/uL (ref 0.0–0.5)
Eosinophils Relative: 3 %
HCT: 43.4 % (ref 36.0–46.0)
Hemoglobin: 14.9 g/dL (ref 12.0–15.0)
Immature Granulocytes: 0 %
Lymphocytes Relative: 42 %
Lymphs Abs: 2.7 10*3/uL (ref 0.7–4.0)
MCH: 28.4 pg (ref 26.0–34.0)
MCHC: 34.3 g/dL (ref 30.0–36.0)
MCV: 82.8 fL (ref 80.0–100.0)
Monocytes Absolute: 0.6 10*3/uL (ref 0.1–1.0)
Monocytes Relative: 9 %
Neutro Abs: 2.9 10*3/uL (ref 1.7–7.7)
Neutrophils Relative %: 45 %
Platelets: 234 10*3/uL (ref 150–400)
RBC: 5.24 MIL/uL — ABNORMAL HIGH (ref 3.87–5.11)
RDW: 11.5 % (ref 11.5–15.5)
WBC: 6.4 10*3/uL (ref 4.0–10.5)
nRBC: 0 % (ref 0.0–0.2)

## 2019-09-05 LAB — COMPREHENSIVE METABOLIC PANEL
ALT: 25 U/L (ref 0–44)
AST: 21 U/L (ref 15–41)
Albumin: 4 g/dL (ref 3.5–5.0)
Alkaline Phosphatase: 81 U/L (ref 38–126)
Anion gap: 10 (ref 5–15)
BUN: 11 mg/dL (ref 6–20)
CO2: 27 mmol/L (ref 22–32)
Calcium: 9.5 mg/dL (ref 8.9–10.3)
Chloride: 103 mmol/L (ref 98–111)
Creatinine, Ser: 0.66 mg/dL (ref 0.44–1.00)
GFR calc Af Amer: >60 mL/min (ref >60)
GFR calc non Af Amer: >60 mL/min (ref >60)
Glucose, Bld: 84 mg/dL (ref 70–99)
Potassium: 4.2 mmol/L (ref 3.5–5.1)
Sodium: 140 mmol/L (ref 135–145)
Total Bilirubin: 0.8 mg/dL (ref 0.3–1.2)
Total Protein: 7.4 g/dL (ref 6.5–8.1)

## 2019-09-05 NOTE — Patient Instructions (Addendum)
Your procedure is scheduled on: Wed 09/12/19 Report to [facility] [phone]) at 8:15. Day Surgery [phone]   Remember: Instructions that are not followed completely may result in serious medical risk, up to and including death, or upon the discretion of your surgeon and anesthesiologist your surgery may need to be rescheduled.     _X__ 1. Do not eat food after midnight the night before your procedure.                 No gum chewing or hard candies. You may drink clear liquids up to 2 hours                 before you are scheduled to arrive for your surgery- DO not drink clear                 liquids within 2 hours of the start of your surgery.                 Clear Liquids include:  water, apple juice without pulp, clear carbohydrate                 drink such as Clearfast or Gatorade, Black Coffee or Tea (Do not add                 anything to coffee or tea). Diabetics water only  __X__2.  On the morning of surgery brush your teeth with toothpaste and water, you                 may rinse your mouth with mouthwash if you wish.  Do not swallow any              toothpaste of mouthwash.     _X__ 3.  No Alcohol for 24 hours before or after surgery.   _X__ 4.  Do Not Smoke or use e-cigarettes For 24 Hours Prior to Your Surgery.                 Do not use any chewable tobacco products for at least 6 hours prior to                 surgery.  ____  5.  Bring all medications with you on the day of surgery if instructed.   __X__  6.  Notify your doctor if there is any change in your medical condition      (cold, fever, infections).     Do not wear jewelry, make-up, hairpins, clips or nail polish. Do not wear lotions, powders, or perfumes.  Do not shave 48 hours prior to surgery. Men may shave face and neck. Do not bring valuables to the hospital.    [facility] is not responsible for any belongings or valuables.  Contacts, dentures/partials or body piercings may not be  worn into surgery. Bring a case for your contacts, glasses or hearing aids, a denture cup will be supplied. Leave your suitcase in the car. After surgery it may be brought to your room. For patients admitted to the hospital, discharge time is determined by your treatment team.   Patients discharged the day of surgery will not be allowed to drive home.   Please read over the following fact sheets that you were given:   MRSA Information  __X__ Take these medicines the morning of surgery with A SIP OF WATER:    1. pantoprazole (PROTONIX) 40 MG tablet  2.   3.  4.  5.  6.  ____ Fleet Enema (as directed)   __X__ Use CHG Soap/SAGE wipes as directed  ____ Use inhalers on the day of surgery  ____ Stop metformin/Janumet/Farxiga 2 days prior to surgery    ____ Take 1/2 of usual insulin dose the night before surgery. No insulin the morning          of surgery.   ____ Stop Blood Thinners Coumadin/Plavix/Xarelto/Pleta/Pradaxa/Eliquis/Effient/Aspirin  on   Or contact your Surgeon, Cardiologist or Medical Doctor regarding  ability to stop your blood thinners  __X__ Stop Anti-inflammatories 7 days before surgery such as Advil, Ibuprofen, Motrin,  BC or Goodies Powder, Naprosyn, Naproxen, Aleve, Aspirin    __X__ Stop all herbal supplements, fish oil or vitamin E until after surgery.    ____ Bring C-Pap to the hospital.

## 2019-09-07 ENCOUNTER — Encounter: Payer: Self-pay | Admitting: Surgery

## 2019-09-07 ENCOUNTER — Encounter: Payer: Self-pay | Admitting: *Deleted

## 2019-09-10 ENCOUNTER — Other Ambulatory Visit: Payer: Self-pay

## 2019-09-10 ENCOUNTER — Inpatient Hospital Stay: Payer: 59

## 2019-09-10 ENCOUNTER — Other Ambulatory Visit
Admission: RE | Admit: 2019-09-10 | Discharge: 2019-09-10 | Disposition: A | Discharge status: Home / Self Care | Payer: 59 | Source: Ambulatory Visit | Attending: Surgery | Admitting: Surgery

## 2019-09-10 DIAGNOSIS — Z01812 Encounter for preprocedural laboratory examination: Secondary | ICD-10-CM | POA: Diagnosis not present

## 2019-09-10 DIAGNOSIS — Z20828 Contact with and (suspected) exposure to other viral communicable diseases: Secondary | ICD-10-CM | POA: Insufficient documentation

## 2019-09-10 LAB — SARS CORONAVIRUS 2 (TAT 6-24 HRS): SARS Coronavirus 2: NEGATIVE

## 2019-09-11 DIAGNOSIS — C50919 Malignant neoplasm of unspecified site of unspecified female breast: Secondary | ICD-10-CM

## 2019-09-11 HISTORY — DX: Malignant neoplasm of unspecified site of unspecified female breast: C50.919

## 2019-09-11 NOTE — Progress Notes (Signed)
  Oncology Nurse Navigator Documentation  Navigator Location: [facility]Med Onc (09/11/19 1400)   )Navigator Encounter Type: Telephone (09/11/19 1400) Telephone: Appt Confirmation/Clarification;Incoming Call (09/11/19 1400)     Expected Surgery Date: 09/12/19 (09/11/19 1400)             Patient Visit Type: Surgery (09/11/19 1400) Treatment Phase: Pre-Tx/Tx Discussion (09/11/19 1400)     Interventions: Coordination of Care;Psycho-Social Support (09/11/19 1400)                      Time Spent with Patient: 15 (09/11/19 1400)   Patient phoned with pre-op questions.  Confirmed appointment, anfd visitor information.

## 2019-09-12 ENCOUNTER — Encounter: Payer: Self-pay | Admitting: Physical Therapy

## 2019-09-12 ENCOUNTER — Ambulatory Visit
Admission: RE | Admit: 2019-09-12 | Discharge: 2019-09-12 | Disposition: A | Discharge status: Home / Self Care | Payer: 59 | Source: Ambulatory Visit | Attending: Surgery | Admitting: Surgery

## 2019-09-12 ENCOUNTER — Ambulatory Visit
Admission: RE | Admit: 2019-09-12 | Discharge: 2019-09-12 | Disposition: A | Discharge status: Home / Self Care | Payer: 59 | Attending: Surgery | Admitting: Surgery

## 2019-09-12 ENCOUNTER — Encounter: Payer: Self-pay | Admitting: *Deleted

## 2019-09-12 ENCOUNTER — Ambulatory Visit: Payer: 59 | Admitting: Certified Registered Nurse Anesthetist

## 2019-09-12 ENCOUNTER — Other Ambulatory Visit: Payer: Self-pay

## 2019-09-12 ENCOUNTER — Encounter
Admission: RE | Disposition: A | Discharge status: Home / Self Care | Payer: Self-pay | Source: Home / Self Care | Attending: Surgery

## 2019-09-12 ENCOUNTER — Ambulatory Visit: Payer: 59

## 2019-09-12 DIAGNOSIS — Z79899 Other long term (current) drug therapy: Secondary | ICD-10-CM | POA: Insufficient documentation

## 2019-09-12 DIAGNOSIS — Z6828 Body mass index (BMI) 28.0-28.9, adult: Secondary | ICD-10-CM | POA: Diagnosis not present

## 2019-09-12 DIAGNOSIS — C50211 Malignant neoplasm of upper-inner quadrant of right female breast: Secondary | ICD-10-CM | POA: Diagnosis not present

## 2019-09-12 DIAGNOSIS — C50911 Malignant neoplasm of unspecified site of right female breast: Secondary | ICD-10-CM | POA: Diagnosis present

## 2019-09-12 DIAGNOSIS — Z170 Estrogen receptor positive status [ER+]: Secondary | ICD-10-CM

## 2019-09-12 DIAGNOSIS — M1990 Unspecified osteoarthritis, unspecified site: Secondary | ICD-10-CM | POA: Diagnosis not present

## 2019-09-12 DIAGNOSIS — E785 Hyperlipidemia, unspecified: Secondary | ICD-10-CM | POA: Insufficient documentation

## 2019-09-12 DIAGNOSIS — F329 Major depressive disorder, single episode, unspecified: Secondary | ICD-10-CM | POA: Diagnosis not present

## 2019-09-12 DIAGNOSIS — F419 Anxiety disorder, unspecified: Secondary | ICD-10-CM | POA: Diagnosis not present

## 2019-09-12 DIAGNOSIS — M419 Scoliosis, unspecified: Secondary | ICD-10-CM | POA: Insufficient documentation

## 2019-09-12 DIAGNOSIS — E669 Obesity, unspecified: Secondary | ICD-10-CM | POA: Insufficient documentation

## 2019-09-12 DIAGNOSIS — K219 Gastro-esophageal reflux disease without esophagitis: Secondary | ICD-10-CM | POA: Insufficient documentation

## 2019-09-12 HISTORY — PX: AXILLARY SENTINEL NODE BIOPSY: SHX5738

## 2019-09-12 HISTORY — PX: BREAST EXCISIONAL BIOPSY: SUR124

## 2019-09-12 HISTORY — PX: BREAST LUMPECTOMY: SHX2

## 2019-09-12 HISTORY — PX: BREAST LUMPECTOMY WITH NEEDLE LOCALIZATION: SHX5759

## 2019-09-12 SURGERY — BREAST LUMPECTOMY WITH NEEDLE LOCALIZATION
Anesthesia: General | Laterality: Right

## 2019-09-12 MED ORDER — MIDAZOLAM HCL 2 MG/2ML IJ SOLN
INTRAMUSCULAR | Status: AC
  Filled 2019-09-12: qty 2

## 2019-09-12 MED ORDER — ACETAMINOPHEN 500 MG PO TABS
1000.0000 mg | ORAL_TABLET | ORAL | Status: AC
  Administered 2019-09-12: 10:00:00 1000 mg via ORAL

## 2019-09-12 MED ORDER — IBUPROFEN 600 MG PO TABS: 600.0000 mg | ORAL_TABLET | Freq: Three times a day (TID) | ORAL | 0 refills | Status: DC | PRN

## 2019-09-12 MED ORDER — SODIUM CHLORIDE (PF) 0.9 % IJ SOLN
INTRAVENOUS | Status: DC | PRN
  Administered 2019-09-12: 5 mL

## 2019-09-12 MED ORDER — CLINDAMYCIN PHOSPHATE 900 MG/50ML IV SOLN
INTRAVENOUS | Status: AC
  Filled 2019-09-12: qty 50

## 2019-09-12 MED ORDER — EPINEPHRINE PF 1 MG/ML IJ SOLN
INTRAMUSCULAR | Status: AC
  Filled 2019-09-12: qty 1

## 2019-09-12 MED ORDER — CLINDAMYCIN PHOSPHATE 900 MG/50ML IV SOLN
900.0000 mg | INTRAVENOUS | Status: AC
  Administered 2019-09-12: 900 mg via INTRAVENOUS

## 2019-09-12 MED ORDER — CHLORHEXIDINE GLUCONATE CLOTH 2 % EX PADS: 6.0000 | MEDICATED_PAD | Freq: Once | CUTANEOUS | Status: DC

## 2019-09-12 MED ORDER — METHYLENE BLUE 0.5 % INJ SOLN
INTRAVENOUS | Status: AC
  Filled 2019-09-12: qty 10

## 2019-09-12 MED ORDER — OXYCODONE HCL 5 MG/5ML PO SOLN: 5.0000 mg | Freq: Once | ORAL | Status: DC | PRN

## 2019-09-12 MED ORDER — MEPERIDINE HCL 50 MG/ML IJ SOLN: 6.2500 mg | INTRAMUSCULAR | Status: DC | PRN

## 2019-09-12 MED ORDER — EPHEDRINE SULFATE 50 MG/ML IJ SOLN
INTRAMUSCULAR | Status: DC | PRN
  Administered 2019-09-12: 5 mg via INTRAVENOUS

## 2019-09-12 MED ORDER — LIDOCAINE HCL (CARDIAC) PF 100 MG/5ML IV SOSY
PREFILLED_SYRINGE | INTRAVENOUS | Status: DC | PRN
  Administered 2019-09-12: 80 mg via INTRAVENOUS

## 2019-09-12 MED ORDER — GABAPENTIN 300 MG PO CAPS
ORAL_CAPSULE | ORAL | Status: AC
  Administered 2019-09-12: 300 mg via ORAL
  Filled 2019-09-12: qty 1

## 2019-09-12 MED ORDER — FENTANYL CITRATE (PF) 100 MCG/2ML IJ SOLN
25.0000 ug | INTRAMUSCULAR | Status: DC | PRN
  Administered 2019-09-12 (×3): 25 ug via INTRAVENOUS

## 2019-09-12 MED ORDER — FENTANYL CITRATE (PF) 100 MCG/2ML IJ SOLN
INTRAMUSCULAR | Status: AC
  Filled 2019-09-12: qty 2

## 2019-09-12 MED ORDER — SODIUM CHLORIDE (PF) 0.9 % IJ SOLN
INTRAMUSCULAR | Status: AC
  Filled 2019-09-12: qty 10

## 2019-09-12 MED ORDER — DEXAMETHASONE SODIUM PHOSPHATE 10 MG/ML IJ SOLN
INTRAMUSCULAR | Status: DC | PRN
  Administered 2019-09-12: 10 mg via INTRAVENOUS

## 2019-09-12 MED ORDER — ACETAMINOPHEN 500 MG PO TABS
ORAL_TABLET | ORAL | Status: AC
  Administered 2019-09-12: 1000 mg via ORAL
  Filled 2019-09-12: qty 2

## 2019-09-12 MED ORDER — SODIUM CHLORIDE 0.9 % IV SOLN
INTRAVENOUS | Status: DC | PRN
  Administered 2019-09-12: 25 ug/min via INTRAVENOUS

## 2019-09-12 MED ORDER — PHENYLEPHRINE HCL (PRESSORS) 10 MG/ML IV SOLN
INTRAVENOUS | Status: DC | PRN
  Administered 2019-09-12 (×3): 100 ug via INTRAVENOUS

## 2019-09-12 MED ORDER — PROPOFOL 10 MG/ML IV BOLUS
INTRAVENOUS | Status: AC
  Filled 2019-09-12: qty 20

## 2019-09-12 MED ORDER — TECHNETIUM TC 99M SULFUR COLLOID FILTERED
1.0000 | Freq: Once | INTRAVENOUS | Status: AC | PRN
  Administered 2019-09-12: 1 via INTRADERMAL

## 2019-09-12 MED ORDER — FENTANYL CITRATE (PF) 100 MCG/2ML IJ SOLN
INTRAMUSCULAR | Status: DC | PRN
  Administered 2019-09-12 (×4): 25 ug via INTRAVENOUS

## 2019-09-12 MED ORDER — LACTATED RINGERS IV SOLN
INTRAVENOUS | Status: DC
  Administered 2019-09-12 (×2): via INTRAVENOUS

## 2019-09-12 MED ORDER — PROPOFOL 10 MG/ML IV BOLUS
INTRAVENOUS | Status: DC | PRN
  Administered 2019-09-12: 140 mg via INTRAVENOUS
  Administered 2019-09-12: 30 mg via INTRAVENOUS

## 2019-09-12 MED ORDER — OXYCODONE HCL 5 MG PO TABS: 5.0000 mg | ORAL_TABLET | Freq: Once | ORAL | Status: DC | PRN

## 2019-09-12 MED ORDER — MIDAZOLAM HCL 2 MG/2ML IJ SOLN
INTRAMUSCULAR | Status: DC | PRN
  Administered 2019-09-12: 2 mg via INTRAVENOUS

## 2019-09-12 MED ORDER — BUPIVACAINE-EPINEPHRINE 0.5% -1:200000 IJ SOLN
INTRAMUSCULAR | Status: DC | PRN
  Administered 2019-09-12: 10 mL
  Administered 2019-09-12: 20 mL

## 2019-09-12 MED ORDER — BUPIVACAINE HCL (PF) 0.5 % IJ SOLN
INTRAMUSCULAR | Status: AC
  Filled 2019-09-12: qty 30

## 2019-09-12 MED ORDER — GABAPENTIN 300 MG PO CAPS
300.0000 mg | ORAL_CAPSULE | ORAL | Status: AC
  Administered 2019-09-12: 10:00:00 300 mg via ORAL

## 2019-09-12 MED ORDER — OXYCODONE HCL 5 MG PO TABS: 5.0000 mg | ORAL_TABLET | ORAL | 0 refills | Status: DC | PRN

## 2019-09-12 MED ORDER — ONDANSETRON HCL 4 MG/2ML IJ SOLN
INTRAMUSCULAR | Status: DC | PRN
  Administered 2019-09-12: 4 mg via INTRAVENOUS

## 2019-09-12 MED ORDER — GLYCOPYRROLATE 0.2 MG/ML IJ SOLN
INTRAMUSCULAR | Status: DC | PRN
  Administered 2019-09-12: 0.2 mg via INTRAVENOUS

## 2019-09-12 MED ORDER — PROMETHAZINE HCL 25 MG/ML IJ SOLN: 6.2500 mg | INTRAMUSCULAR | Status: DC | PRN

## 2019-09-12 SURGICAL SUPPLY — 52 items
APPLIER CLIP 9.375 SM OPEN (CLIP)
BINDER BREAST LRG (GAUZE/BANDAGES/DRESSINGS) IMPLANT
BINDER BREAST MEDIUM (GAUZE/BANDAGES/DRESSINGS) ×2 IMPLANT
BLADE PHOTON ILLUMINATED (MISCELLANEOUS) ×2 IMPLANT
BLADE SURG 15 STRL LF DISP TIS (BLADE) ×2 IMPLANT
BLADE SURG 15 STRL SS (BLADE) ×4
CANISTER SUCT 1200ML W/VALVE (MISCELLANEOUS) ×3 IMPLANT
CHLORAPREP W/TINT 26 (MISCELLANEOUS) ×3 IMPLANT
CLIP APPLIE 9.375 SM OPEN (CLIP) IMPLANT
CNTNR SPEC 2.5X3XGRAD LEK (MISCELLANEOUS)
CONT SPEC 4OZ STER OR WHT (MISCELLANEOUS)
CONTAINER SPEC 2.5X3XGRAD LEK (MISCELLANEOUS) ×1 IMPLANT
COVER PROBE FLX POLY STRL (MISCELLANEOUS) ×3 IMPLANT
COVER WAND RF STERILE (DRAPES) IMPLANT
DERMABOND ADVANCED (GAUZE/BANDAGES/DRESSINGS) ×4
DERMABOND ADVANCED .7 DNX12 (GAUZE/BANDAGES/DRESSINGS) ×1 IMPLANT
DEVICE DUBIN SPECIMEN MAMMOGRA (MISCELLANEOUS) ×3 IMPLANT
DRAPE LAPAROTOMY 100X77 ABD (DRAPES) ×3 IMPLANT
DRSG GAUZE FLUFF 36X18 (GAUZE/BANDAGES/DRESSINGS) ×3 IMPLANT
ELECT CAUTERY BLADE 6.4 (BLADE) ×3 IMPLANT
ELECT REM PT RETURN 9FT ADLT (ELECTROSURGICAL) ×3
ELECTRODE REM PT RTRN 9FT ADLT (ELECTROSURGICAL) ×1 IMPLANT
GLOVE SURG SYN 7.0 (GLOVE) ×3 IMPLANT
GLOVE SURG SYN 7.0 PF PI (GLOVE) ×1 IMPLANT
GLOVE SURG SYN 7.5  E (GLOVE) ×2
GLOVE SURG SYN 7.5 E (GLOVE) ×1 IMPLANT
GLOVE SURG SYN 7.5 PF PI (GLOVE) ×1 IMPLANT
GOWN STRL REUS W/ TWL LRG LVL3 (GOWN DISPOSABLE) ×2 IMPLANT
GOWN STRL REUS W/TWL LRG LVL3 (GOWN DISPOSABLE) ×6
KIT MARKER MARGIN INK (KITS) ×2 IMPLANT
KIT TURNOVER KIT A (KITS) ×3 IMPLANT
LABEL OR SOLS (LABEL) ×3 IMPLANT
MARGIN MAP 10MM (MISCELLANEOUS) ×2 IMPLANT
NDL HYPO 25X1 1.5 SAFETY (NEEDLE) ×1 IMPLANT
NEEDLE HYPO 22GX1.5 SAFETY (NEEDLE) ×3 IMPLANT
NEEDLE HYPO 25X1 1.5 SAFETY (NEEDLE) ×3 IMPLANT
PACK BASIN MINOR ARMC (MISCELLANEOUS) ×3 IMPLANT
SLEVE PROBE SENORX GAMMA FIND (MISCELLANEOUS) ×2 IMPLANT
SUT ETHILON 3-0 FS-10 30 BLK (SUTURE) ×3
SUT MNCRL 3-0 UNDYED SH (SUTURE) IMPLANT
SUT MNCRL 4-0 (SUTURE) ×2
SUT MNCRL 4-0 27XMFL (SUTURE) ×1
SUT MONOCRYL 3-0 UNDYED (SUTURE) ×4
SUT SILK 3 0 SH 30 (SUTURE) ×3 IMPLANT
SUT VIC AB 3-0 SH 27 (SUTURE) ×2
SUT VIC AB 3-0 SH 27X BRD (SUTURE) ×1 IMPLANT
SUTURE EHLN 3-0 FS-10 30 BLK (SUTURE) ×1 IMPLANT
SUTURE MNCRL 4-0 27XMF (SUTURE) ×1 IMPLANT
SYR 10ML LL (SYRINGE) ×5 IMPLANT
SYR BULB IRRIG 60ML STRL (SYRINGE) ×3 IMPLANT
TAPE TRANSPORE STRL 2 31045 (GAUZE/BANDAGES/DRESSINGS) IMPLANT
WATER STERILE IRR 1000ML POUR (IV SOLUTION) ×3 IMPLANT

## 2019-09-12 NOTE — Discharge Instructions (Signed)

## 2019-09-12 NOTE — Interval H&P Note (Signed)
History and Physical Interval Note:  09/12/2019 12:15 PM  Carol Espinoza  has presented today for surgery, with the diagnosis of Right breast cancer.  The various methods of treatment have been discussed with the patient and family. After consideration of risks, benefits and other options for treatment, the patient has consented to  Procedure(s): BREAST LUMPECTOMY WITH NEEDLE LOCALIZATION (Right) AXILLARY SENTINEL NODE BIOPSY (Right) as a surgical intervention.  The patient's history has been reviewed, patient examined, no change in status, stable for surgery.  I have reviewed the patient's chart and labs.  Questions were answered to the patient's satisfaction.     Mckale Haffey

## 2019-09-12 NOTE — Anesthesia Post-op Follow-up Note (Signed)
Anesthesia QCDR form completed.        

## 2019-09-12 NOTE — Transfer of Care (Signed)
Immediate Anesthesia Transfer of Care Note  Patient: Carol Espinoza  Procedure(s) Performed: BREAST LUMPECTOMY WITH NEEDLE LOCALIZATION (Right ) AXILLARY SENTINEL NODE BIOPSY (Right )  Patient Location: PACU  Anesthesia Type:General  Level of Consciousness: drowsy  Airway & Oxygen Therapy: Patient Spontanous Breathing and Patient connected to face mask oxygen  Post-op Assessment: Report given to RN and Post -op Vital signs reviewed and stable  Post vital signs: Reviewed and stable  Last Vitals:  Vitals Value Taken Time  BP 100/62 09/12/19 1705  Temp 36.3 C 09/12/19 1705  Pulse 86 09/12/19 1708  Resp 15 09/12/19 1708  SpO2 100 % 09/12/19 1708  Vitals shown include unvalidated device data.  Last Pain:  Vitals:   09/12/19 1705  TempSrc:   PainSc: Asleep      Patients Stated Pain Goal: 2 ([phone])  Complications: No apparent anesthesia complications

## 2019-09-12 NOTE — Anesthesia Procedure Notes (Signed)
Procedure Name: LMA Insertion Performed by: Fletcher-Harrison, Christop Hippert, CRNA Pre-anesthesia Checklist: Patient identified, Emergency Drugs available, Suction available and Patient being monitored Patient Re-evaluated:Patient Re-evaluated prior to induction Oxygen Delivery Method: Circle system utilized Preoxygenation: Pre-oxygenation with 100% oxygen Induction Type: IV induction Ventilation: Mask ventilation without difficulty LMA: LMA inserted LMA Size: 4.0 Tube type: Oral Number of attempts: 1 Placement Confirmation: positive ETCO2,  CO2 detector and breath sounds checked- equal and bilateral Tube secured with: Tape Dental Injury: Teeth and Oropharynx as per pre-operative assessment        

## 2019-09-12 NOTE — Anesthesia Postprocedure Evaluation (Signed)
Anesthesia Post Note  Patient: Carol Espinoza  Procedure(s) Performed: BREAST LUMPECTOMY WITH NEEDLE LOCALIZATION (Right ) AXILLARY SENTINEL NODE BIOPSY (Right )  Patient location during evaluation: PACU Anesthesia Type: General Level of consciousness: awake and alert Pain management: pain level controlled Vital Signs Assessment: post-procedure vital signs reviewed and stable Respiratory status: spontaneous breathing, nonlabored ventilation, respiratory function stable and patient connected to nasal cannula oxygen Cardiovascular status: blood pressure returned to baseline and stable Postop Assessment: no apparent nausea or vomiting Anesthetic complications: no     Last Vitals:  Vitals:   09/12/19 1754 09/12/19 1756  BP:  (!) 101/43  Pulse: 96 89  Resp: 12 13  Temp:    SpO2: 95% 93%    Last Pain:  Vitals:   09/12/19 1756  TempSrc:   PainSc: 5                  Precious Haws Ramiz Turpin
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
## 2019-09-12 NOTE — Op Note (Signed)
  Procedure Date:  09/12/2019  Pre-operative Diagnosis:  Right breast cancer  Post-operative Diagnosis:  Right breast cancer  Procedure:  Right breast wire-localized lumpectomy and sentinel lymph node biopsy  Surgeon:  Melvyn Neth, MD  Assistant:  Murlean Caller, PA-S  Anesthesia:  General endotracheal  Estimated Blood Loss:  20 ml  Specimens:  Sentinel lymph nodes 1 and 2, right breast mass  Complications:  None  Indications for Procedure:  This is a 58 y.o. female who presents with right breast cancer.  The risks of bleeding, infection, injury to surrounding structures, hematoma, seroma, open wound, cosmetic deformity, and the need for further surgery were all discussed with the patient and was willing to proceed.  Prior to this procedure, the patient had undergone wire localization and sentinel lymphoscintigraphy.  Description of Procedure: The patient was correctly identified in the preoperative area and brought into the operating room.  The patient was placed supine with VTE prophylaxis in place.  Appropriate time-outs were performed.  Anesthesia was induced and the patient was intubated.  Appropriate antibiotics were infused.  A visual dye was injected in the right periareolar region under aseptic conditions. The right chest and axilla were prepped and draped in usual sterile fashion.  Then using the hand-held probe an area of high counts was identified in the axilla, and a 5 cm incision was made.  Cautery was used to dissect down the subcutaneous tissue and the hand-held probe was used to guide dissection. A hot and blue lymph node was identified and resected, using hemoclips to clip the lymphatic vessels.  This had a count of 792.  An additional lymph node was identified and resected with counts of 154.  The wound bed had a count of 11.  The cavity was irrigated and hemostasis was assured with electrocautery.  Local anesthetic was infiltrated into the skin and subcutaneous tissue  of the cavity.  The wound was then closed in two layers with 3-0 Monocryl and 4-0 Monocryl and sealed with DermaBond.  Attention was turned to the needle localization site where an incision was made encompassing the needle insertion site. Elecrocautery was used for dissection around the needle to perform a partial mastectomy with adequate margins.  The specimen was oriented using the [employer] engineer and also with the metal MarginMap.  The specimen including the wire was sent to radiology which confirmed an intact wire and prior biopsy site clip.  The cavity was irrigated and hemostasis was assured with electrocautery.  Local anesthetic was infiltrated into the skin and subcutaneous tissue of the cavity.  The wound was then closed in two layers with 3-0 Monocryl and 4-0 Monocryl and sealed with DermaBond.  The patient was emerged from anesthesia and extubated and brought to the recovery room for further management.  The patient tolerated the procedure well and all counts were correct at the end of the case.   Melvyn Neth, MD

## 2019-09-13 ENCOUNTER — Encounter: Payer: Self-pay | Admitting: Surgery

## 2019-09-14 ENCOUNTER — Encounter: Payer: Self-pay | Admitting: Oncology

## 2019-09-14 LAB — SURGICAL PATHOLOGY

## 2019-09-17 ENCOUNTER — Encounter: Payer: Self-pay | Admitting: Oncology

## 2019-09-18 ENCOUNTER — Telehealth: Payer: Self-pay | Admitting: *Deleted

## 2019-09-18 NOTE — Telephone Encounter (Signed)
Patient called stating she needed a work excuse note to allow her to work from home from 12/08-12/22 per Dr.Zach said that is it OK to excuse the patient for another couple of weeks. Patient has a breast lumpectomy on 09/12/19 by Dr.Piscoya. Patient confirmed information provided above was correct and I will fax it over to [employer].

## 2019-09-18 NOTE — Telephone Encounter (Signed)
Patient called and is wanting a return to work from home and needs a work note staying dec 10th-22nd she can work from home, fax [phone] Attention to [name] [employer]- [employer].

## 2019-09-19 ENCOUNTER — Encounter: Payer: Self-pay | Admitting: Physical Therapy

## 2019-09-21 ENCOUNTER — Encounter: Payer: Self-pay | Admitting: *Deleted

## 2019-09-21 ENCOUNTER — Telehealth: Payer: Self-pay | Admitting: *Deleted

## 2019-09-21 NOTE — Telephone Encounter (Signed)
Patient sent a MyChart message to call her in regards to her Oncotype DX results.  Confirmed her interpretation of low risk and less than 1% benefit from chemotherapy.
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
## 2019-09-25 ENCOUNTER — Other Ambulatory Visit: Payer: Self-pay

## 2019-09-25 NOTE — Progress Notes (Signed)
Patient pre screened for office appointment, no questions or concerns today. Patient reminded of upcoming appointment time and date. 

## 2019-09-26 ENCOUNTER — Encounter: Payer: Self-pay | Admitting: Radiation Oncology

## 2019-09-26 ENCOUNTER — Ambulatory Visit
Admission: RE | Admit: 2019-09-26 | Discharge: 2019-09-26 | Disposition: A | Discharge status: Home / Self Care | Payer: Managed Care, Other (non HMO) | Source: Ambulatory Visit | Attending: Radiation Oncology | Admitting: Radiation Oncology

## 2019-09-26 ENCOUNTER — Other Ambulatory Visit: Payer: Self-pay

## 2019-09-26 ENCOUNTER — Inpatient Hospital Stay: Payer: Managed Care, Other (non HMO) | Attending: Oncology | Admitting: Oncology

## 2019-09-26 VITALS — BP 120/62 | HR 86 | Temp 98.2°F | Resp 16 | Wt 158.6 lb

## 2019-09-26 VITALS — BP 121/52 | HR 82 | Temp 98.2°F | Resp 16 | Wt 159.7 lb

## 2019-09-26 DIAGNOSIS — K219 Gastro-esophageal reflux disease without esophagitis: Secondary | ICD-10-CM | POA: Insufficient documentation

## 2019-09-26 DIAGNOSIS — M129 Arthropathy, unspecified: Secondary | ICD-10-CM | POA: Insufficient documentation

## 2019-09-26 DIAGNOSIS — Z170 Estrogen receptor positive status [ER+]: Secondary | ICD-10-CM

## 2019-09-26 DIAGNOSIS — E785 Hyperlipidemia, unspecified: Secondary | ICD-10-CM | POA: Insufficient documentation

## 2019-09-26 DIAGNOSIS — F329 Major depressive disorder, single episode, unspecified: Secondary | ICD-10-CM | POA: Diagnosis not present

## 2019-09-26 DIAGNOSIS — C50211 Malignant neoplasm of upper-inner quadrant of right female breast: Secondary | ICD-10-CM

## 2019-09-26 DIAGNOSIS — Z79899 Other long term (current) drug therapy: Secondary | ICD-10-CM | POA: Diagnosis not present

## 2019-09-26 DIAGNOSIS — F418 Other specified anxiety disorders: Secondary | ICD-10-CM | POA: Diagnosis not present
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
## 2019-09-28 ENCOUNTER — Other Ambulatory Visit: Payer: Self-pay

## 2019-09-28 ENCOUNTER — Ambulatory Visit (INDEPENDENT_AMBULATORY_CARE_PROVIDER_SITE_OTHER): Payer: Managed Care, Other (non HMO) | Admitting: Surgery

## 2019-09-28 ENCOUNTER — Encounter: Payer: Self-pay | Admitting: Surgery

## 2019-09-28 VITALS — BP 102/70 | HR 80 | Temp 97.7°F | Resp 14 | Wt 157.0 lb

## 2019-09-28 DIAGNOSIS — Z170 Estrogen receptor positive status [ER+]: Secondary | ICD-10-CM

## 2019-09-28 DIAGNOSIS — C50211 Malignant neoplasm of upper-inner quadrant of right female breast: Secondary | ICD-10-CM

## 2019-09-28 DIAGNOSIS — Z09 Encounter for follow-up examination after completed treatment for conditions other than malignant neoplasm: Secondary | ICD-10-CM

## 2019-09-28 NOTE — Patient Instructions (Addendum)
You may resume normal activities  You may stop wearing the binder. Please do range of motion exercises with your arms daily to help with the soreness. If you notice any swelling please start wearing the binder until the swelling goes away. Once the glue falls off you may start shaving your under arms.   You may begin wearing Deoderant.  We will contact you May 2021 to schedule 6 month breast exam for June 2021.

## 2019-09-30 ENCOUNTER — Encounter: Payer: Self-pay | Admitting: Surgery

## 2019-09-30 NOTE — Progress Notes (Signed)
09/30/2019  HPI: Carol Espinoza is a 58 y.o. female s/p right breast lumpectomy and SLNBx on 09/12/19 for right breast cancer.  Nodes were negative and her stage is IA.  Her oncotype score was 10, and she has seen Dr. Grayland Ormond already and determined that she did not need chemotherapy.  However, given her ER/PR positivity, she would benefit from aromatase inhibitor.  She's also seen Dr. Baruch Gouty and she's scheduled for CT simulation on 12/28 for radiation.  Patient reports she's doing well and denies any discomfort at the lumpectomy site, but she did get bruising and more discomfort at the axilla.  This is improving.  She denies any issues with the incisions, drainage, or wound breakdown.  Vital signs: BP 102/70   Pulse 80   Temp 97.7 F (36.5 C)   Resp 14   Wt 71.2 kg   LMP 05/11/2017 (Approximate) Comment: 1 time bleeding 12/2017 after d/c pill  SpO2 97%   BMI 28.72 kg/m    Physical Exam: Constitutional: No acute distress Breast:  Right breast s/p upper inner lumpectomy, healing well, without any erythema or induration.  No palpable masses.  Right axillary incision healing well, with some surrounding ecchymosis and swelling, but no erythema or induration.  There is some mild soreness to palpation over the axilla.  This is limiting some of her right shoulder range of motion.  Assessment/Plan: This is a 58 y.o. female s/p right breast lumpectomy and SLNBx.  --Reviewed with her pathology results again and agree that she does not need chemotherapy, but she would benefit from radiation.  She's scheduled for CT simulation on 12/28, and she will follow up with Dr. Grayland Ormond after radiation to start an aromatase inhibitor. --Discussed with her that the likely reason for her soreness in that we had to do further dissection in order to find her lymph nodes.  This likely resulted in the ecchymosis and additional soreness.  This will continue to improve.   --Recommended that she start doing range of  motion exercises with her right arm/shoulder in order to help with the scarring and flexibility. --Follow up in 6 months for breast exam.   Melvyn Neth, MD [facility]

## 2019-10-02 ENCOUNTER — Other Ambulatory Visit: Payer: Self-pay

## 2019-10-08 ENCOUNTER — Ambulatory Visit
Admission: RE | Admit: 2019-10-08 | Discharge: 2019-10-08 | Disposition: A | Discharge status: Home / Self Care | Payer: Managed Care, Other (non HMO) | Source: Ambulatory Visit | Attending: Radiation Oncology | Admitting: Radiation Oncology

## 2019-10-08 ENCOUNTER — Other Ambulatory Visit: Payer: Self-pay

## 2019-10-08 DIAGNOSIS — C50211 Malignant neoplasm of upper-inner quadrant of right female breast: Secondary | ICD-10-CM | POA: Insufficient documentation

## 2019-10-08 DIAGNOSIS — Z170 Estrogen receptor positive status [ER+]: Secondary | ICD-10-CM | POA: Diagnosis not present

## 2019-10-11 ENCOUNTER — Other Ambulatory Visit: Payer: Self-pay | Admitting: *Deleted

## 2019-10-11 DIAGNOSIS — C50211 Malignant neoplasm of upper-inner quadrant of right female breast: Secondary | ICD-10-CM

## 2019-10-11 DIAGNOSIS — Z170 Estrogen receptor positive status [ER+]: Secondary | ICD-10-CM

## 2019-10-12 DIAGNOSIS — Z923 Personal history of irradiation: Secondary | ICD-10-CM

## 2019-10-12 HISTORY — DX: Personal history of irradiation: Z92.3

## 2019-10-15 ENCOUNTER — Ambulatory Visit
Admission: RE | Admit: 2019-10-15 | Discharge: 2019-10-15 | Disposition: A | Discharge status: Home / Self Care | Payer: Managed Care, Other (non HMO) | Source: Ambulatory Visit | Attending: Radiation Oncology | Admitting: Radiation Oncology

## 2019-10-15 ENCOUNTER — Other Ambulatory Visit: Payer: Self-pay

## 2019-10-15 DIAGNOSIS — Z170 Estrogen receptor positive status [ER+]: Secondary | ICD-10-CM | POA: Diagnosis not present

## 2019-10-15 DIAGNOSIS — C50211 Malignant neoplasm of upper-inner quadrant of right female breast: Secondary | ICD-10-CM | POA: Insufficient documentation

## 2019-10-16 ENCOUNTER — Other Ambulatory Visit: Payer: Self-pay

## 2019-10-16 ENCOUNTER — Ambulatory Visit
Admission: RE | Admit: 2019-10-16 | Discharge: 2019-10-16 | Disposition: A | Discharge status: Home / Self Care | Payer: Managed Care, Other (non HMO) | Source: Ambulatory Visit | Attending: Radiation Oncology | Admitting: Radiation Oncology

## 2019-10-16 DIAGNOSIS — C50211 Malignant neoplasm of upper-inner quadrant of right female breast: Secondary | ICD-10-CM | POA: Diagnosis not present

## 2019-10-17 ENCOUNTER — Ambulatory Visit
Admission: RE | Admit: 2019-10-17 | Discharge: 2019-10-17 | Disposition: A | Discharge status: Home / Self Care | Payer: Managed Care, Other (non HMO) | Source: Ambulatory Visit | Attending: Radiation Oncology | Admitting: Radiation Oncology

## 2019-10-17 ENCOUNTER — Other Ambulatory Visit: Payer: Self-pay

## 2019-10-17 DIAGNOSIS — C50211 Malignant neoplasm of upper-inner quadrant of right female breast: Secondary | ICD-10-CM | POA: Diagnosis not present

## 2019-10-18 ENCOUNTER — Other Ambulatory Visit: Payer: Self-pay

## 2019-10-18 ENCOUNTER — Ambulatory Visit
Admission: RE | Admit: 2019-10-18 | Discharge: 2019-10-18 | Disposition: A | Discharge status: Home / Self Care | Payer: Managed Care, Other (non HMO) | Source: Ambulatory Visit | Attending: Radiation Oncology | Admitting: Radiation Oncology

## 2019-10-18 DIAGNOSIS — C50211 Malignant neoplasm of upper-inner quadrant of right female breast: Secondary | ICD-10-CM | POA: Diagnosis not present

## 2019-10-19 ENCOUNTER — Ambulatory Visit
Admission: RE | Admit: 2019-10-19 | Discharge: 2019-10-19 | Disposition: A | Discharge status: Home / Self Care | Payer: Managed Care, Other (non HMO) | Source: Ambulatory Visit | Attending: Radiation Oncology | Admitting: Radiation Oncology

## 2019-10-19 ENCOUNTER — Other Ambulatory Visit: Payer: Self-pay

## 2019-10-19 DIAGNOSIS — C50211 Malignant neoplasm of upper-inner quadrant of right female breast: Secondary | ICD-10-CM | POA: Diagnosis not present

## 2019-10-22 ENCOUNTER — Other Ambulatory Visit: Payer: Self-pay

## 2019-10-22 ENCOUNTER — Ambulatory Visit
Admission: RE | Admit: 2019-10-22 | Discharge: 2019-10-22 | Disposition: A | Discharge status: Home / Self Care | Payer: Managed Care, Other (non HMO) | Source: Ambulatory Visit | Attending: Radiation Oncology | Admitting: Radiation Oncology

## 2019-10-22 DIAGNOSIS — C50211 Malignant neoplasm of upper-inner quadrant of right female breast: Secondary | ICD-10-CM | POA: Diagnosis not present

## 2019-10-23 ENCOUNTER — Ambulatory Visit
Admission: RE | Admit: 2019-10-23 | Discharge: 2019-10-23 | Disposition: A | Discharge status: Home / Self Care | Payer: Managed Care, Other (non HMO) | Source: Ambulatory Visit | Attending: Radiation Oncology | Admitting: Radiation Oncology

## 2019-10-23 ENCOUNTER — Other Ambulatory Visit: Payer: Self-pay

## 2019-10-23 DIAGNOSIS — C50211 Malignant neoplasm of upper-inner quadrant of right female breast: Secondary | ICD-10-CM | POA: Diagnosis not present

## 2019-10-24 ENCOUNTER — Other Ambulatory Visit: Payer: Self-pay

## 2019-10-24 ENCOUNTER — Ambulatory Visit
Admission: RE | Admit: 2019-10-24 | Discharge: 2019-10-24 | Disposition: A | Discharge status: Home / Self Care | Payer: Managed Care, Other (non HMO) | Source: Ambulatory Visit | Attending: Radiation Oncology | Admitting: Radiation Oncology

## 2019-10-24 DIAGNOSIS — C50211 Malignant neoplasm of upper-inner quadrant of right female breast: Secondary | ICD-10-CM | POA: Diagnosis not present

## 2019-10-25 ENCOUNTER — Other Ambulatory Visit: Payer: Self-pay

## 2019-10-25 ENCOUNTER — Ambulatory Visit
Admission: RE | Admit: 2019-10-25 | Discharge: 2019-10-25 | Disposition: A | Discharge status: Home / Self Care | Payer: Managed Care, Other (non HMO) | Source: Ambulatory Visit | Attending: Radiation Oncology | Admitting: Radiation Oncology

## 2019-10-25 DIAGNOSIS — C50211 Malignant neoplasm of upper-inner quadrant of right female breast: Secondary | ICD-10-CM | POA: Diagnosis not present

## 2019-10-26 ENCOUNTER — Ambulatory Visit
Admission: RE | Admit: 2019-10-26 | Discharge: 2019-10-26 | Disposition: A | Discharge status: Home / Self Care | Payer: Managed Care, Other (non HMO) | Source: Ambulatory Visit | Attending: Radiation Oncology | Admitting: Radiation Oncology

## 2019-10-26 ENCOUNTER — Other Ambulatory Visit: Payer: Self-pay

## 2019-10-26 DIAGNOSIS — C50211 Malignant neoplasm of upper-inner quadrant of right female breast: Secondary | ICD-10-CM | POA: Diagnosis not present

## 2019-10-29 ENCOUNTER — Ambulatory Visit
Admission: RE | Admit: 2019-10-29 | Discharge: 2019-10-29 | Disposition: A | Discharge status: Home / Self Care | Payer: Managed Care, Other (non HMO) | Source: Ambulatory Visit | Attending: Radiation Oncology | Admitting: Radiation Oncology

## 2019-10-29 ENCOUNTER — Other Ambulatory Visit: Payer: Self-pay

## 2019-10-29 DIAGNOSIS — C50211 Malignant neoplasm of upper-inner quadrant of right female breast: Secondary | ICD-10-CM | POA: Diagnosis not present

## 2019-10-30 ENCOUNTER — Ambulatory Visit
Admission: RE | Admit: 2019-10-30 | Discharge: 2019-10-30 | Disposition: A | Discharge status: Home / Self Care | Payer: Managed Care, Other (non HMO) | Source: Ambulatory Visit | Attending: Radiation Oncology | Admitting: Radiation Oncology

## 2019-10-30 ENCOUNTER — Other Ambulatory Visit: Payer: Self-pay

## 2019-10-30 DIAGNOSIS — C50211 Malignant neoplasm of upper-inner quadrant of right female breast: Secondary | ICD-10-CM | POA: Diagnosis not present

## 2019-10-31 ENCOUNTER — Ambulatory Visit
Admission: RE | Admit: 2019-10-31 | Discharge: 2019-10-31 | Disposition: A | Discharge status: Home / Self Care | Payer: Managed Care, Other (non HMO) | Source: Ambulatory Visit | Attending: Radiation Oncology | Admitting: Radiation Oncology

## 2019-10-31 ENCOUNTER — Other Ambulatory Visit: Payer: Self-pay

## 2019-10-31 DIAGNOSIS — C50211 Malignant neoplasm of upper-inner quadrant of right female breast: Secondary | ICD-10-CM | POA: Diagnosis not present

## 2019-11-01 ENCOUNTER — Other Ambulatory Visit: Payer: Self-pay

## 2019-11-01 ENCOUNTER — Ambulatory Visit
Admission: RE | Admit: 2019-11-01 | Discharge: 2019-11-01 | Disposition: A | Discharge status: Home / Self Care | Payer: Managed Care, Other (non HMO) | Source: Ambulatory Visit | Attending: Radiation Oncology | Admitting: Radiation Oncology

## 2019-11-01 ENCOUNTER — Inpatient Hospital Stay: Payer: Managed Care, Other (non HMO) | Attending: Radiation Oncology

## 2019-11-01 DIAGNOSIS — Z170 Estrogen receptor positive status [ER+]: Secondary | ICD-10-CM | POA: Insufficient documentation

## 2019-11-01 DIAGNOSIS — C50211 Malignant neoplasm of upper-inner quadrant of right female breast: Secondary | ICD-10-CM | POA: Diagnosis not present

## 2019-11-01 LAB — CBC
HCT: 42.2 % (ref 36.0–46.0)
Hemoglobin: 13.8 g/dL (ref 12.0–15.0)
MCH: 28.7 pg (ref 26.0–34.0)
MCHC: 32.7 g/dL (ref 30.0–36.0)
MCV: 87.7 fL (ref 80.0–100.0)
Platelets: 221 10*3/uL (ref 150–400)
RBC: 4.81 MIL/uL (ref 3.87–5.11)
RDW: 11.8 % (ref 11.5–15.5)
WBC: 4.9 10*3/uL (ref 4.0–10.5)
nRBC: 0 % (ref 0.0–0.2)

## 2019-11-02 ENCOUNTER — Other Ambulatory Visit: Payer: Self-pay

## 2019-11-02 ENCOUNTER — Ambulatory Visit
Admission: RE | Admit: 2019-11-02 | Discharge: 2019-11-02 | Disposition: A | Discharge status: Home / Self Care | Payer: Managed Care, Other (non HMO) | Source: Ambulatory Visit | Attending: Radiation Oncology | Admitting: Radiation Oncology

## 2019-11-02 DIAGNOSIS — C50211 Malignant neoplasm of upper-inner quadrant of right female breast: Secondary | ICD-10-CM | POA: Diagnosis not present

## 2019-11-05 ENCOUNTER — Ambulatory Visit
Admission: RE | Admit: 2019-11-05 | Discharge: 2019-11-05 | Disposition: A | Discharge status: Home / Self Care | Payer: Managed Care, Other (non HMO) | Source: Ambulatory Visit | Attending: Radiation Oncology | Admitting: Radiation Oncology

## 2019-11-05 ENCOUNTER — Other Ambulatory Visit: Payer: Self-pay

## 2019-11-05 DIAGNOSIS — C50211 Malignant neoplasm of upper-inner quadrant of right female breast: Secondary | ICD-10-CM | POA: Diagnosis not present

## 2019-11-06 ENCOUNTER — Ambulatory Visit
Admission: RE | Admit: 2019-11-06 | Discharge: 2019-11-06 | Disposition: A | Discharge status: Home / Self Care | Payer: Managed Care, Other (non HMO) | Source: Ambulatory Visit | Attending: Radiation Oncology | Admitting: Radiation Oncology

## 2019-11-06 ENCOUNTER — Other Ambulatory Visit: Payer: Self-pay

## 2019-11-06 DIAGNOSIS — C50211 Malignant neoplasm of upper-inner quadrant of right female breast: Secondary | ICD-10-CM | POA: Diagnosis not present

## 2019-11-07 ENCOUNTER — Ambulatory Visit
Admission: RE | Admit: 2019-11-07 | Discharge: 2019-11-07 | Disposition: A | Discharge status: Home / Self Care | Payer: Managed Care, Other (non HMO) | Source: Ambulatory Visit | Attending: Radiation Oncology | Admitting: Radiation Oncology

## 2019-11-07 ENCOUNTER — Other Ambulatory Visit: Payer: Self-pay

## 2019-11-07 DIAGNOSIS — C50211 Malignant neoplasm of upper-inner quadrant of right female breast: Secondary | ICD-10-CM | POA: Diagnosis not present

## 2019-11-08 ENCOUNTER — Ambulatory Visit
Admission: RE | Admit: 2019-11-08 | Discharge: 2019-11-08 | Disposition: A | Discharge status: Home / Self Care | Payer: Managed Care, Other (non HMO) | Source: Ambulatory Visit | Attending: Radiation Oncology | Admitting: Radiation Oncology

## 2019-11-08 ENCOUNTER — Other Ambulatory Visit: Payer: Self-pay

## 2019-11-08 DIAGNOSIS — C50211 Malignant neoplasm of upper-inner quadrant of right female breast: Secondary | ICD-10-CM | POA: Diagnosis not present

## 2019-11-09 ENCOUNTER — Ambulatory Visit
Admission: RE | Admit: 2019-11-09 | Discharge: 2019-11-09 | Disposition: A | Discharge status: Home / Self Care | Payer: Managed Care, Other (non HMO) | Source: Ambulatory Visit | Attending: Radiation Oncology | Admitting: Radiation Oncology

## 2019-11-09 ENCOUNTER — Other Ambulatory Visit: Payer: Self-pay

## 2019-11-09 DIAGNOSIS — C50211 Malignant neoplasm of upper-inner quadrant of right female breast: Secondary | ICD-10-CM | POA: Diagnosis not present

## 2019-11-12 ENCOUNTER — Ambulatory Visit
Admission: RE | Admit: 2019-11-12 | Discharge: 2019-11-12 | Disposition: A | Discharge status: Home / Self Care | Payer: Managed Care, Other (non HMO) | Source: Ambulatory Visit | Attending: Radiation Oncology | Admitting: Radiation Oncology

## 2019-11-12 ENCOUNTER — Other Ambulatory Visit: Payer: Self-pay

## 2019-11-12 DIAGNOSIS — E785 Hyperlipidemia, unspecified: Secondary | ICD-10-CM | POA: Diagnosis not present

## 2019-11-12 DIAGNOSIS — Z791 Long term (current) use of non-steroidal anti-inflammatories (NSAID): Secondary | ICD-10-CM | POA: Diagnosis not present

## 2019-11-12 DIAGNOSIS — Z170 Estrogen receptor positive status [ER+]: Secondary | ICD-10-CM | POA: Insufficient documentation

## 2019-11-12 DIAGNOSIS — Z79810 Long term (current) use of selective estrogen receptor modulators (SERMs): Secondary | ICD-10-CM | POA: Diagnosis not present

## 2019-11-12 DIAGNOSIS — Z803 Family history of malignant neoplasm of breast: Secondary | ICD-10-CM | POA: Diagnosis not present

## 2019-11-12 DIAGNOSIS — M419 Scoliosis, unspecified: Secondary | ICD-10-CM | POA: Diagnosis not present

## 2019-11-12 DIAGNOSIS — Z79899 Other long term (current) drug therapy: Secondary | ICD-10-CM | POA: Diagnosis not present

## 2019-11-12 DIAGNOSIS — Z510 Encounter for antineoplastic radiation therapy: Secondary | ICD-10-CM | POA: Diagnosis present

## 2019-11-12 DIAGNOSIS — K219 Gastro-esophageal reflux disease without esophagitis: Secondary | ICD-10-CM | POA: Diagnosis not present

## 2019-11-12 DIAGNOSIS — C50211 Malignant neoplasm of upper-inner quadrant of right female breast: Secondary | ICD-10-CM | POA: Insufficient documentation

## 2019-11-12 DIAGNOSIS — F329 Major depressive disorder, single episode, unspecified: Secondary | ICD-10-CM | POA: Diagnosis not present

## 2019-11-12 DIAGNOSIS — M1990 Unspecified osteoarthritis, unspecified site: Secondary | ICD-10-CM | POA: Diagnosis not present

## 2019-11-13 ENCOUNTER — Other Ambulatory Visit: Payer: Self-pay

## 2019-11-13 ENCOUNTER — Ambulatory Visit
Admission: RE | Admit: 2019-11-13 | Discharge: 2019-11-13 | Disposition: A | Discharge status: Home / Self Care | Payer: Managed Care, Other (non HMO) | Source: Ambulatory Visit | Attending: Radiation Oncology | Admitting: Radiation Oncology

## 2019-11-13 DIAGNOSIS — Z510 Encounter for antineoplastic radiation therapy: Secondary | ICD-10-CM | POA: Diagnosis not present

## 2019-11-14 ENCOUNTER — Ambulatory Visit
Admission: RE | Admit: 2019-11-14 | Discharge: 2019-11-14 | Disposition: A | Discharge status: Home / Self Care | Payer: Managed Care, Other (non HMO) | Source: Ambulatory Visit | Attending: Radiation Oncology | Admitting: Radiation Oncology

## 2019-11-14 ENCOUNTER — Other Ambulatory Visit: Payer: Self-pay

## 2019-11-14 DIAGNOSIS — Z510 Encounter for antineoplastic radiation therapy: Secondary | ICD-10-CM | POA: Diagnosis not present

## 2019-11-15 ENCOUNTER — Other Ambulatory Visit: Payer: Self-pay

## 2019-11-15 ENCOUNTER — Ambulatory Visit
Admission: RE | Admit: 2019-11-15 | Discharge: 2019-11-15 | Disposition: A | Discharge status: Home / Self Care | Payer: Managed Care, Other (non HMO) | Source: Ambulatory Visit | Attending: Radiation Oncology | Admitting: Radiation Oncology

## 2019-11-15 ENCOUNTER — Inpatient Hospital Stay: Payer: Managed Care, Other (non HMO) | Attending: Radiation Oncology

## 2019-11-15 DIAGNOSIS — M419 Scoliosis, unspecified: Secondary | ICD-10-CM | POA: Insufficient documentation

## 2019-11-15 DIAGNOSIS — C50211 Malignant neoplasm of upper-inner quadrant of right female breast: Secondary | ICD-10-CM | POA: Insufficient documentation

## 2019-11-15 DIAGNOSIS — Z79899 Other long term (current) drug therapy: Secondary | ICD-10-CM | POA: Insufficient documentation

## 2019-11-15 DIAGNOSIS — E785 Hyperlipidemia, unspecified: Secondary | ICD-10-CM | POA: Insufficient documentation

## 2019-11-15 DIAGNOSIS — F329 Major depressive disorder, single episode, unspecified: Secondary | ICD-10-CM | POA: Insufficient documentation

## 2019-11-15 DIAGNOSIS — K219 Gastro-esophageal reflux disease without esophagitis: Secondary | ICD-10-CM | POA: Insufficient documentation

## 2019-11-15 DIAGNOSIS — Z803 Family history of malignant neoplasm of breast: Secondary | ICD-10-CM | POA: Insufficient documentation

## 2019-11-15 DIAGNOSIS — Z791 Long term (current) use of non-steroidal anti-inflammatories (NSAID): Secondary | ICD-10-CM | POA: Insufficient documentation

## 2019-11-15 DIAGNOSIS — Z79810 Long term (current) use of selective estrogen receptor modulators (SERMs): Secondary | ICD-10-CM | POA: Insufficient documentation

## 2019-11-15 DIAGNOSIS — Z510 Encounter for antineoplastic radiation therapy: Secondary | ICD-10-CM | POA: Insufficient documentation

## 2019-11-15 DIAGNOSIS — Z170 Estrogen receptor positive status [ER+]: Secondary | ICD-10-CM | POA: Insufficient documentation

## 2019-11-15 DIAGNOSIS — M1990 Unspecified osteoarthritis, unspecified site: Secondary | ICD-10-CM | POA: Insufficient documentation

## 2019-11-15 LAB — CBC
HCT: 45 % (ref 36.0–46.0)
Hemoglobin: 14.5 g/dL (ref 12.0–15.0)
MCH: 28.4 pg (ref 26.0–34.0)
MCHC: 32.2 g/dL (ref 30.0–36.0)
MCV: 88.2 fL (ref 80.0–100.0)
Platelets: 220 10*3/uL (ref 150–400)
RBC: 5.1 MIL/uL (ref 3.87–5.11)
RDW: 11.7 % (ref 11.5–15.5)
WBC: 4.3 10*3/uL (ref 4.0–10.5)
nRBC: 0 % (ref 0.0–0.2)

## 2019-11-16 ENCOUNTER — Ambulatory Visit
Admission: RE | Admit: 2019-11-16 | Discharge: 2019-11-16 | Disposition: A | Discharge status: Home / Self Care | Payer: Managed Care, Other (non HMO) | Source: Ambulatory Visit | Attending: Radiation Oncology | Admitting: Radiation Oncology

## 2019-11-16 ENCOUNTER — Other Ambulatory Visit: Payer: Self-pay

## 2019-11-16 DIAGNOSIS — Z510 Encounter for antineoplastic radiation therapy: Secondary | ICD-10-CM | POA: Diagnosis not present

## 2019-11-19 ENCOUNTER — Other Ambulatory Visit: Payer: Self-pay

## 2019-11-19 ENCOUNTER — Ambulatory Visit
Admission: RE | Admit: 2019-11-19 | Discharge: 2019-11-19 | Disposition: A | Discharge status: Home / Self Care | Payer: Managed Care, Other (non HMO) | Source: Ambulatory Visit | Attending: Radiation Oncology | Admitting: Radiation Oncology

## 2019-11-19 DIAGNOSIS — Z510 Encounter for antineoplastic radiation therapy: Secondary | ICD-10-CM | POA: Diagnosis not present

## 2019-11-20 ENCOUNTER — Ambulatory Visit
Admission: RE | Admit: 2019-11-20 | Discharge: 2019-11-20 | Disposition: A | Discharge status: Home / Self Care | Payer: Managed Care, Other (non HMO) | Source: Ambulatory Visit | Attending: Radiation Oncology | Admitting: Radiation Oncology

## 2019-11-20 ENCOUNTER — Other Ambulatory Visit: Payer: Self-pay | Admitting: *Deleted

## 2019-11-20 ENCOUNTER — Other Ambulatory Visit: Payer: Self-pay

## 2019-11-20 DIAGNOSIS — Z510 Encounter for antineoplastic radiation therapy: Secondary | ICD-10-CM | POA: Diagnosis not present

## 2019-11-20 MED ORDER — SILVER SULFADIAZINE 1 % EX CREA: 1.0000 "application " | TOPICAL_CREAM | Freq: Two times a day (BID) | CUTANEOUS | 2 refills | Status: DC

## 2019-11-21 ENCOUNTER — Ambulatory Visit
Admission: RE | Admit: 2019-11-21 | Discharge: 2019-11-21 | Disposition: A | Discharge status: Home / Self Care | Payer: Managed Care, Other (non HMO) | Source: Ambulatory Visit | Attending: Radiation Oncology | Admitting: Radiation Oncology

## 2019-11-21 ENCOUNTER — Other Ambulatory Visit: Payer: Self-pay

## 2019-11-21 DIAGNOSIS — Z510 Encounter for antineoplastic radiation therapy: Secondary | ICD-10-CM | POA: Diagnosis not present

## 2019-11-22 ENCOUNTER — Other Ambulatory Visit: Payer: Self-pay

## 2019-11-22 ENCOUNTER — Ambulatory Visit
Admission: RE | Admit: 2019-11-22 | Discharge: 2019-11-22 | Disposition: A | Discharge status: Home / Self Care | Payer: Managed Care, Other (non HMO) | Source: Ambulatory Visit | Attending: Radiation Oncology | Admitting: Radiation Oncology

## 2019-11-22 DIAGNOSIS — Z510 Encounter for antineoplastic radiation therapy: Secondary | ICD-10-CM | POA: Diagnosis not present

## 2019-11-23 ENCOUNTER — Other Ambulatory Visit: Payer: Self-pay

## 2019-11-23 ENCOUNTER — Ambulatory Visit
Admission: RE | Admit: 2019-11-23 | Discharge: 2019-11-23 | Disposition: A | Discharge status: Home / Self Care | Payer: Managed Care, Other (non HMO) | Source: Ambulatory Visit | Attending: Radiation Oncology | Admitting: Radiation Oncology

## 2019-11-23 DIAGNOSIS — Z510 Encounter for antineoplastic radiation therapy: Secondary | ICD-10-CM | POA: Diagnosis not present
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
## 2019-11-26 ENCOUNTER — Ambulatory Visit
Admission: RE | Admit: 2019-11-26 | Discharge: 2019-11-26 | Disposition: A | Discharge status: Home / Self Care | Payer: Managed Care, Other (non HMO) | Source: Ambulatory Visit | Attending: Radiation Oncology | Admitting: Radiation Oncology

## 2019-11-26 ENCOUNTER — Other Ambulatory Visit: Payer: Self-pay

## 2019-11-26 DIAGNOSIS — Z510 Encounter for antineoplastic radiation therapy: Secondary | ICD-10-CM | POA: Diagnosis not present

## 2019-11-27 ENCOUNTER — Ambulatory Visit
Admission: RE | Admit: 2019-11-27 | Discharge: 2019-11-27 | Disposition: A | Discharge status: Home / Self Care | Payer: Managed Care, Other (non HMO) | Source: Ambulatory Visit | Attending: Radiation Oncology | Admitting: Radiation Oncology

## 2019-11-27 ENCOUNTER — Other Ambulatory Visit: Payer: Self-pay

## 2019-11-27 DIAGNOSIS — Z510 Encounter for antineoplastic radiation therapy: Secondary | ICD-10-CM | POA: Diagnosis not present

## 2019-11-28 ENCOUNTER — Other Ambulatory Visit: Payer: Self-pay

## 2019-11-28 ENCOUNTER — Encounter: Payer: Self-pay | Admitting: Oncology

## 2019-11-28 ENCOUNTER — Inpatient Hospital Stay (HOSPITAL_BASED_OUTPATIENT_CLINIC_OR_DEPARTMENT_OTHER): Payer: Managed Care, Other (non HMO) | Admitting: Oncology

## 2019-11-28 ENCOUNTER — Inpatient Hospital Stay: Payer: Managed Care, Other (non HMO)

## 2019-11-28 ENCOUNTER — Ambulatory Visit
Admission: RE | Admit: 2019-11-28 | Discharge: 2019-11-28 | Disposition: A | Discharge status: Home / Self Care | Payer: Managed Care, Other (non HMO) | Source: Ambulatory Visit | Attending: Radiation Oncology | Admitting: Radiation Oncology

## 2019-11-28 VITALS — BP 111/66 | HR 87 | Temp 97.8°F | Wt 158.9 lb

## 2019-11-28 DIAGNOSIS — C50211 Malignant neoplasm of upper-inner quadrant of right female breast: Secondary | ICD-10-CM

## 2019-11-28 DIAGNOSIS — Z170 Estrogen receptor positive status [ER+]: Secondary | ICD-10-CM | POA: Diagnosis not present

## 2019-11-28 DIAGNOSIS — Z510 Encounter for antineoplastic radiation therapy: Secondary | ICD-10-CM | POA: Diagnosis not present

## 2019-11-28 LAB — CBC
HCT: 43.6 % (ref 36.0–46.0)
Hemoglobin: 14.1 g/dL (ref 12.0–15.0)
MCH: 28.3 pg (ref 26.0–34.0)
MCHC: 32.3 g/dL (ref 30.0–36.0)
MCV: 87.6 fL (ref 80.0–100.0)
Platelets: 182 10*3/uL (ref 150–400)
RBC: 4.98 MIL/uL (ref 3.87–5.11)
RDW: 11.7 % (ref 11.5–15.5)
WBC: 3.6 10*3/uL — ABNORMAL LOW (ref 4.0–10.5)
nRBC: 0 % (ref 0.0–0.2)

## 2019-11-28 MED ORDER — LETROZOLE 2.5 MG PO TABS: 2.5000 mg | ORAL_TABLET | Freq: Every day | ORAL | 3 refills | Status: DC

## 2019-11-28 NOTE — Progress Notes (Signed)
Pt here for follow up. Reports some skin irritation under right arm.

## 2019-11-29 ENCOUNTER — Inpatient Hospital Stay: Payer: Managed Care, Other (non HMO)

## 2019-11-29 ENCOUNTER — Ambulatory Visit
Admission: RE | Admit: 2019-11-29 | Discharge: 2019-11-29 | Disposition: A | Discharge status: Home / Self Care | Payer: Managed Care, Other (non HMO) | Source: Ambulatory Visit | Attending: Radiation Oncology | Admitting: Radiation Oncology

## 2019-11-29 ENCOUNTER — Other Ambulatory Visit: Payer: Self-pay

## 2019-11-29 DIAGNOSIS — Z510 Encounter for antineoplastic radiation therapy: Secondary | ICD-10-CM | POA: Diagnosis not present

## 2019-11-30 ENCOUNTER — Ambulatory Visit
Admission: RE | Admit: 2019-11-30 | Discharge: 2019-11-30 | Disposition: A | Discharge status: Home / Self Care | Payer: Managed Care, Other (non HMO) | Source: Ambulatory Visit | Attending: Radiation Oncology | Admitting: Radiation Oncology

## 2019-11-30 ENCOUNTER — Other Ambulatory Visit: Payer: Self-pay

## 2019-11-30 DIAGNOSIS — Z510 Encounter for antineoplastic radiation therapy: Secondary | ICD-10-CM | POA: Diagnosis not present

## 2019-12-03 ENCOUNTER — Ambulatory Visit
Admission: RE | Admit: 2019-12-03 | Discharge: 2019-12-03 | Disposition: A | Discharge status: Home / Self Care | Payer: Managed Care, Other (non HMO) | Source: Ambulatory Visit | Attending: Radiation Oncology | Admitting: Radiation Oncology

## 2019-12-03 ENCOUNTER — Other Ambulatory Visit: Payer: Self-pay

## 2019-12-03 DIAGNOSIS — Z510 Encounter for antineoplastic radiation therapy: Secondary | ICD-10-CM | POA: Diagnosis not present

## 2019-12-04 ENCOUNTER — Other Ambulatory Visit: Payer: Self-pay

## 2019-12-04 ENCOUNTER — Ambulatory Visit
Admission: RE | Admit: 2019-12-04 | Discharge: 2019-12-04 | Disposition: A | Discharge status: Home / Self Care | Payer: Managed Care, Other (non HMO) | Source: Ambulatory Visit | Attending: Radiation Oncology | Admitting: Radiation Oncology

## 2019-12-04 ENCOUNTER — Encounter: Payer: Self-pay | Admitting: *Deleted

## 2019-12-04 DIAGNOSIS — Z510 Encounter for antineoplastic radiation therapy: Secondary | ICD-10-CM | POA: Diagnosis not present

## 2019-12-10 DIAGNOSIS — M8589 Other specified disorders of bone density and structure, multiple sites: Secondary | ICD-10-CM | POA: Insufficient documentation

## 2019-12-10 DIAGNOSIS — M4125 Other idiopathic scoliosis, thoracolumbar region: Secondary | ICD-10-CM | POA: Insufficient documentation

## 2019-12-11 DIAGNOSIS — E559 Vitamin D deficiency, unspecified: Secondary | ICD-10-CM | POA: Insufficient documentation

## 2019-12-11 DIAGNOSIS — E538 Deficiency of other specified B group vitamins: Secondary | ICD-10-CM | POA: Insufficient documentation

## 2019-12-29 ENCOUNTER — Ambulatory Visit: Payer: 59 | Attending: Internal Medicine

## 2019-12-29 DIAGNOSIS — Z23 Encounter for immunization: Secondary | ICD-10-CM

## 2019-12-29 NOTE — Progress Notes (Signed)
   Covid-19 Vaccination Clinic  Name:  Carol Espinoza    MRN: [id number] DOB: Sep 12, 1961  12/29/2019  [name] was observed post Covid-19 immunization for 15 minutes without incident. She was provided with Vaccine Information Sheet and instruction to access the V-Safe system.   [name] was instructed to call 911 with any severe reactions post vaccine: Marland Kitchen Difficulty breathing  . Swelling of face and throat  . A fast heartbeat  . A bad rash all over body  . Dizziness and weakness   Immunizations Administered    Name Date Dose VIS Date Route   Pfizer COVID-19 Vaccine 12/29/2019 11:45 AM 0.3 mL 09/21/2019 Intramuscular   Manufacturer: [place]   Lot: [id number]   [place]: [id number]

## 2020-01-03 ENCOUNTER — Other Ambulatory Visit: Payer: Self-pay

## 2020-01-07 ENCOUNTER — Ambulatory Visit
Admission: RE | Admit: 2020-01-07 | Discharge: 2020-01-07 | Disposition: A | Discharge status: Home / Self Care | Payer: 59 | Source: Ambulatory Visit | Attending: Radiation Oncology | Admitting: Radiation Oncology

## 2020-01-07 VITALS — BP 98/66 | HR 91 | Temp 97.4°F | Resp 16 | Wt 157.1 lb

## 2020-01-07 DIAGNOSIS — Z79811 Long term (current) use of aromatase inhibitors: Secondary | ICD-10-CM | POA: Diagnosis not present

## 2020-01-07 DIAGNOSIS — Z170 Estrogen receptor positive status [ER+]: Secondary | ICD-10-CM | POA: Diagnosis not present

## 2020-01-07 DIAGNOSIS — C50211 Malignant neoplasm of upper-inner quadrant of right female breast: Secondary | ICD-10-CM | POA: Diagnosis present

## 2020-01-07 DIAGNOSIS — Z923 Personal history of irradiation: Secondary | ICD-10-CM | POA: Diagnosis not present

## 2020-01-07 DIAGNOSIS — R232 Flushing: Secondary | ICD-10-CM | POA: Diagnosis not present

## 2020-01-07 NOTE — Progress Notes (Signed)
Radiation Oncology Follow up Note  Name: Carol Espinoza   Date:   01/07/2020 MRN:  [id number] DOB: [birth year]    This 59 y.o. female presents to the clinic today for 1 month follow-up status post whole breast radiation to her right breast for ER/PR positive stage Ia invasive mammary carcinoma.  REFERRING PROVIDER: No ref. provider found  HPI: Patient is a 59 year old female now out 1 month having completed whole breast radiation to her right breast for ER/PR positive invasive mammary carcinoma seen today in routine follow-up she is doing well.  She specifically denies breast tenderness cough or bone pain..  She has been started on Femara tolerate that well without side effect.  COMPLICATIONS OF TREATMENT: none  FOLLOW UP COMPLIANCE: keeps appointments   PHYSICAL EXAM:  BP 98/66   Pulse 91   Temp (!) 97.4 F (36.3 C)   Resp 16   Wt 157 lb 1.6 oz (71.3 kg)   LMP 05/11/2017 (Approximate) Comment: 1 time bleeding 12/2017 after d/c pill  SpO2 98%   BMI 28.73 kg/m  Lungs are clear to A&P cardiac examination essentially unremarkable with regular rate and rhythm. No dominant mass or nodularity is noted in either breast in 2 positions examined. Incision is well-healed. No axillary or supraclavicular adenopathy is appreciated. Cosmetic result is excellent.  Well-developed well-nourished patient in NAD. HEENT reveals PERLA, EOMI, discs not visualized.  Oral cavity is clear. No oral mucosal lesions are identified. Neck is clear without evidence of cervical or supraclavicular adenopathy. Lungs are clear to A&P. Cardiac examination is essentially unremarkable with regular rate and rhythm without murmur rub or thrill. Abdomen is benign with no organomegaly or masses noted. Motor sensory and DTR levels are equal and symmetric in the upper and lower extremities. Cranial nerves II through XII are grossly intact. Proprioception is intact. No peripheral adenopathy or edema is identified. No motor or sensory  levels are noted. Crude visual fields are within normal range.  RADIOLOGY RESULTS: No current films to review  PLAN: Present time patient is doing well.  I am pleased with her overall progress.  I have asked to see her back in 4 to 5 months for follow-up.  Patient knows to call with any concerns at any time.  I have suggested some vitamin D since she is having some mild hot flashes.  Patient knows to call with any concerns.  I would like to take this opportunity to thank you for allowing me to participate in the care of your patient.Noreene Filbert, MD

## 2020-01-15 ENCOUNTER — Telehealth: Payer: Self-pay

## 2020-01-15 DIAGNOSIS — C50211 Malignant neoplasm of upper-inner quadrant of right female breast: Secondary | ICD-10-CM

## 2020-01-15 NOTE — Telephone Encounter (Signed)
Survivorship Care Plan visit completed.  Treatment summary reviewed and mailed to patient.  ASCO answers booklet reviewed and mailed to patient.  CARE program and Cancer Transitions discussed with patient along with other resources cancer center offers to patients and caregivers.  Patient verbalized understanding.  SCP packet mailed.  Patient in agreement for APP to have a Virtual visit to introduce them to the Survivorship Clinic.  Encouraged patient to call for any questions or concerns. 

## 2020-01-22 ENCOUNTER — Ambulatory Visit: Payer: 59 | Attending: Internal Medicine

## 2020-01-22 DIAGNOSIS — Z23 Encounter for immunization: Secondary | ICD-10-CM

## 2020-01-22 NOTE — Progress Notes (Signed)
   Covid-19 Vaccination Clinic  Name:  CORLA SCRITCHFIELD    MRN: [id number] DOB: [birth year]  01/22/2020  Ms. Lacko was observed post Covid-19 immunization for 15 minutes without incident. She was provided with Vaccine Information Sheet and instruction to access the V-Safe system.   Ms. Zukauskas was instructed to call 911 with any severe reactions post vaccine: Marland Kitchen Difficulty breathing  . Swelling of face and throat  . A fast heartbeat  . A bad rash all over body  . Dizziness and weakness   Immunizations Administered    Name Date Dose VIS Date Route   Pfizer COVID-19 Vaccine 01/22/2020  1:16 PM 0.3 mL 09/21/2019 Intramuscular   Manufacturer: [place]   Lot: [id number]   [place]: [id number]

## 2020-01-28 ENCOUNTER — Inpatient Hospital Stay: Payer: 59 | Attending: Oncology | Admitting: Oncology

## 2020-01-28 DIAGNOSIS — C50211 Malignant neoplasm of upper-inner quadrant of right female breast: Secondary | ICD-10-CM

## 2020-01-28 DIAGNOSIS — Z170 Estrogen receptor positive status [ER+]: Secondary | ICD-10-CM
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
## 2020-02-06 ENCOUNTER — Ambulatory Visit
Admission: RE | Admit: 2020-02-06 | Discharge: 2020-02-06 | Disposition: A | Discharge status: Home / Self Care | Payer: 59 | Source: Ambulatory Visit | Attending: Oncology | Admitting: Oncology

## 2020-02-06 DIAGNOSIS — Z170 Estrogen receptor positive status [ER+]: Secondary | ICD-10-CM | POA: Insufficient documentation

## 2020-02-06 DIAGNOSIS — C50211 Malignant neoplasm of upper-inner quadrant of right female breast: Secondary | ICD-10-CM | POA: Diagnosis not present
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
## 2020-02-26 ENCOUNTER — Ambulatory Visit: Payer: Managed Care, Other (non HMO) | Admitting: Oncology

## 2020-02-27 ENCOUNTER — Encounter: Payer: Self-pay | Admitting: Oncology

## 2020-02-27 ENCOUNTER — Other Ambulatory Visit: Payer: Self-pay

## 2020-02-27 NOTE — Progress Notes (Signed)
Patient prescreened for appointment. Pt reports having some difficulty sleeping, and sometimes "feeling warm, but I wouldn't describe them as hot flashes" and states she hasn't been experiencing anything that would make her stop taking the letrozole.

## 2020-02-28 ENCOUNTER — Encounter: Payer: Self-pay | Admitting: Oncology

## 2020-02-28 ENCOUNTER — Inpatient Hospital Stay: Payer: 59 | Attending: Oncology | Admitting: Oncology

## 2020-02-28 VITALS — BP 105/70 | HR 89 | Temp 97.8°F | Resp 18 | Wt 154.5 lb

## 2020-02-28 DIAGNOSIS — C50211 Malignant neoplasm of upper-inner quadrant of right female breast: Secondary | ICD-10-CM | POA: Insufficient documentation

## 2020-02-28 DIAGNOSIS — M8580 Other specified disorders of bone density and structure, unspecified site: Secondary | ICD-10-CM | POA: Diagnosis not present

## 2020-02-28 DIAGNOSIS — Z923 Personal history of irradiation: Secondary | ICD-10-CM | POA: Diagnosis not present

## 2020-02-28 DIAGNOSIS — Z79811 Long term (current) use of aromatase inhibitors: Secondary | ICD-10-CM | POA: Insufficient documentation

## 2020-02-28 DIAGNOSIS — Z808 Family history of malignant neoplasm of other organs or systems: Secondary | ICD-10-CM | POA: Diagnosis not present

## 2020-02-28 DIAGNOSIS — Z170 Estrogen receptor positive status [ER+]: Secondary | ICD-10-CM

## 2020-02-28 DIAGNOSIS — Z8049 Family history of malignant neoplasm of other genital organs: Secondary | ICD-10-CM | POA: Diagnosis not present

## 2020-03-31 ENCOUNTER — Encounter: Payer: Self-pay | Admitting: Surgery

## 2020-03-31 ENCOUNTER — Ambulatory Visit (INDEPENDENT_AMBULATORY_CARE_PROVIDER_SITE_OTHER): Payer: 59 | Admitting: Surgery

## 2020-03-31 ENCOUNTER — Other Ambulatory Visit: Payer: Self-pay

## 2020-03-31 VITALS — BP 109/73 | HR 82 | Temp 97.7°F | Resp 12 | Ht 62.0 in | Wt 154.0 lb

## 2020-03-31 DIAGNOSIS — C50211 Malignant neoplasm of upper-inner quadrant of right female breast: Secondary | ICD-10-CM | POA: Diagnosis not present

## 2020-03-31 DIAGNOSIS — Z170 Estrogen receptor positive status [ER+]: Secondary | ICD-10-CM

## 2020-03-31 NOTE — Patient Instructions (Addendum)
Please continue with self breast exams monthly.  We will reach out to you in November 2021 to schedule your Mammogram and follow up breast exam with Dr.Piscoya. If you have not heard from our office by the end of November please call our office.      Breast Self-Awareness Breast self-awareness is knowing how your breasts look and feel. Doing breast self-awareness is important. It allows you to catch a breast problem early while it is still small and can be treated. All women should do breast self-awareness, including women who have had breast implants. Tell your doctor if you notice a change in your breasts. What you need:  A mirror.  A well-lit room. How to do a breast self-exam A breast self-exam is one way to learn what is normal for your breasts and to check for changes. To do a breast self-exam: Look for changes  1. Take off all the clothes above your waist. 2. Stand in front of a mirror in a room with good lighting. 3. Put your hands on your hips. 4. Push your hands down. 5. Look at your breasts and nipples in the mirror to see if one breast or nipple looks different from the other. Check to see if: ? The shape of one breast is different. ? The size of one breast is different. ? There are wrinkles, dips, and bumps in one breast and not the other. 6. Look at each breast for changes in the skin, such as: ? Redness. ? Scaly areas. 7. Look for changes in your nipples, such as: ? Liquid around the nipples. ? Bleeding. ? Dimpling. ? Redness. ? A change in where the nipples are. Feel for changes  1. Lie on your back on the floor. 2. Feel each breast. To do this, follow these steps: ? Pick a breast to feel. ? Put the arm closest to that breast above your head. ? Use your other arm to feel the nipple area of your breast. Feel the area with the pads of your three middle fingers by making small circles with your fingers. For the first circle, press lightly. For the second circle,  press harder. For the third circle, press even harder. ? Keep making circles with your fingers at the different pressures as you move down your breast. Stop when you feel your ribs. ? Move your fingers a little toward the center of your body. ? Start making circles with your fingers again, this time going up until you reach your collarbone. ? Keep making up-and-down circles until you reach your armpit. Remember to keep using the three pressures. ? Feel the other breast in the same way. 3. Sit or stand in the tub or shower. 4. With soapy water on your skin, feel each breast the same way you did in step 2 when you were lying on the floor. Write down what you find Writing down what you find can help you remember what to tell your doctor. Write down:  What is normal for each breast.  Any changes you find in each breast, including: ? The kind of changes you find. ? Whether you have pain. ? Size and location of any lumps.  When you last had your menstrual period. General tips  Check your breasts every month.  If you are breastfeeding, the best time to check your breasts is after you feed your baby or after you use a breast pump.  If you get menstrual periods, the best time to check your breasts  is 5-7 days after your menstrual period is over.  With time, you will become comfortable with the self-exam, and you will begin to know if there are changes in your breasts. Contact a doctor if you:  See a change in the shape or size of your breasts or nipples.  See a change in the skin of your breast or nipples, such as red or scaly skin.  Have fluid coming from your nipples that is not normal.  Find a lump or thick area that was not there before.  Have pain in your breasts.  Have any concerns about your breast health. Summary  Breast self-awareness includes looking for changes in your breasts, as well as feeling for changes within your breasts.  Breast self-awareness should be done in  front of a mirror in a well-lit room.  You should check your breasts every month. If you get menstrual periods, the best time to check your breasts is 5-7 days after your menstrual period is over.  Let your doctor know of any changes you see in your breasts, including changes in size, changes on the skin, pain or tenderness, or fluid from your nipples that is not normal. This information is not intended to replace advice given to you by your health care provider. Make sure you discuss any questions you have with your health care provider. Document Revised: 05/16/2018 Document Reviewed: 05/16/2018 Elsevier Patient Education  [place].
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
## 2020-06-10 ENCOUNTER — Other Ambulatory Visit: Payer: Self-pay

## 2020-06-11 ENCOUNTER — Ambulatory Visit
Admission: RE | Admit: 2020-06-11 | Discharge: 2020-06-11 | Disposition: A | Discharge status: Home / Self Care | Payer: 59 | Source: Ambulatory Visit | Attending: Radiation Oncology | Admitting: Radiation Oncology

## 2020-06-11 VITALS — BP 105/52 | HR 72 | Temp 96.0°F | Resp 18 | Wt 152.4 lb

## 2020-06-11 DIAGNOSIS — C50211 Malignant neoplasm of upper-inner quadrant of right female breast: Secondary | ICD-10-CM

## 2020-06-11 NOTE — Progress Notes (Signed)
Radiation Oncology Follow up Note  Name: Carol Espinoza   Date:   06/11/2020 MRN:  [id number] DOB: 12-Mar-1961    This 59 y.o. female presents to the clinic today for 3-month follow-up status post whole breast radiation to right breast for ER/PR positive stage I invasive mammary carcinoma.  REFERRING PROVIDER: No ref. provider found  HPI: Patient is a 59 year old female now at 6 months having completed whole breast radiation to her right breast for stage I ER/PR positive invasive mammary carcinoma.  Seen today in routine follow-up she is doing well she specifically denies breast tenderness cough or bone pain still has some slight occasional tenderness in her axilla most likely from scarring..  She is currently on letrozole tolerating it well without side effect.  She has not yet had follow-up mammograms.  COMPLICATIONS OF TREATMENT: none  FOLLOW UP COMPLIANCE: keeps appointments   PHYSICAL EXAM:  BP (!) 105/52 (BP Location: Left Arm, Patient Position: Sitting)   Pulse 72   Temp (!) 96 F (35.6 C) (Tympanic)   Resp 18   Wt 152 lb 6.4 oz (69.1 kg)   LMP 05/11/2017 (Approximate) Comment: 1 time bleeding 12/2017 after d/c pill  SpO2 99%   BMI 27.87 kg/m  Lungs are clear to A&P cardiac examination essentially unremarkable with regular rate and rhythm. No dominant mass or nodularity is noted in either breast in 2 positions examined. Incision is well-healed. No axillary or supraclavicular adenopathy is appreciated. Cosmetic result is excellent.  Well-developed well-nourished patient in NAD. HEENT reveals PERLA, EOMI, discs not visualized.  Oral cavity is clear. No oral mucosal lesions are identified. Neck is clear without evidence of cervical or supraclavicular adenopathy. Lungs are clear to A&P. Cardiac examination is essentially unremarkable with regular rate and rhythm without murmur rub or thrill. Abdomen is benign with no organomegaly or masses noted. Motor sensory and DTR levels are equal and  symmetric in the upper and lower extremities. Cranial nerves II through XII are grossly intact. Proprioception is intact. No peripheral adenopathy or edema is identified. No motor or sensory levels are noted. Crude visual fields are within normal range.  RADIOLOGY RESULTS: No current films to review  PLAN: Present time patient is doing well with no evidence of disease 6 months out from whole breast radiation.  I am pleased with her overall progress.  She continues on letrozole without side effect.  Will have mammograms ordered in the near future.  I have asked to see her back in 6 months for follow-up.  Patient knows to call with any concerns.  I would like to take this opportunity to thank you for allowing me to participate in the care of your patient.Noreene Filbert, MD

## 2020-08-12 ENCOUNTER — Encounter: Payer: Self-pay | Admitting: *Deleted

## 2020-08-12 DIAGNOSIS — C50911 Malignant neoplasm of unspecified site of right female breast: Secondary | ICD-10-CM

## 2020-08-26 ENCOUNTER — Ambulatory Visit
Admission: RE | Admit: 2020-08-26 | Discharge: 2020-08-26 | Disposition: A | Discharge status: Home / Self Care | Payer: 59 | Source: Ambulatory Visit | Attending: Oncology | Admitting: Oncology

## 2020-08-26 ENCOUNTER — Other Ambulatory Visit: Payer: Self-pay

## 2020-08-26 DIAGNOSIS — C50911 Malignant neoplasm of unspecified site of right female breast: Secondary | ICD-10-CM | POA: Insufficient documentation
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
## 2020-09-02 ENCOUNTER — Inpatient Hospital Stay: Payer: 59 | Attending: Oncology | Admitting: Oncology

## 2020-09-02 ENCOUNTER — Encounter: Payer: Self-pay | Admitting: Oncology

## 2020-09-02 ENCOUNTER — Other Ambulatory Visit: Payer: Self-pay

## 2020-09-02 DIAGNOSIS — C50211 Malignant neoplasm of upper-inner quadrant of right female breast: Secondary | ICD-10-CM

## 2020-09-02 DIAGNOSIS — Z170 Estrogen receptor positive status [ER+]: Secondary | ICD-10-CM

## 2020-09-02 DIAGNOSIS — Z79811 Long term (current) use of aromatase inhibitors: Secondary | ICD-10-CM

## 2020-09-02 NOTE — Progress Notes (Signed)
Patient states she would like to know if letrozole could effect eye floaters.

## 2020-09-03 ENCOUNTER — Encounter: Payer: Self-pay | Admitting: Surgery

## 2020-09-03 ENCOUNTER — Ambulatory Visit (INDEPENDENT_AMBULATORY_CARE_PROVIDER_SITE_OTHER): Payer: 59 | Admitting: Surgery

## 2020-09-03 ENCOUNTER — Other Ambulatory Visit: Payer: Self-pay

## 2020-09-03 VITALS — BP 126/78 | HR 98 | Temp 97.5°F | Ht 60.0 in | Wt 151.0 lb

## 2020-09-03 DIAGNOSIS — Z170 Estrogen receptor positive status [ER+]: Secondary | ICD-10-CM

## 2020-09-03 DIAGNOSIS — C50211 Malignant neoplasm of upper-inner quadrant of right female breast: Secondary | ICD-10-CM
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
## 2020-09-03 NOTE — Patient Instructions (Addendum)
Dr Hampton Abbot discussed with patient that she may try and massage Vitamin E oil to help with healing of the breast. Patient will follow up with our office in 6 months with Dr.Piscoya. Our office will contact you to get you scheduled closer to the timeframe.  Breast Self-Awareness Breast self-awareness is knowing how your breasts look and feel. Doing breast self-awareness is important. It allows you to catch a breast problem early while it is still small and can be treated. All women should do breast self-awareness, including women who have had breast implants. Tell your doctor if you notice a change in your breasts. What you need:  A mirror.  A well-lit room. How to do a breast self-exam A breast self-exam is one way to learn what is normal for your breasts and to check for changes. To do a breast self-exam: Look for changes  1. Take off all the clothes above your waist. 2. Stand in front of a mirror in a room with good lighting. 3. Put your hands on your hips. 4. Push your hands down. 5. Look at your breasts and nipples in the mirror to see if one breast or nipple looks different from the other. Check to see if: ? The shape of one breast is different. ? The size of one breast is different. ? There are wrinkles, dips, and bumps in one breast and not the other. 6. Look at each breast for changes in the skin, such as: ? Redness. ? Scaly areas. 7. Look for changes in your nipples, such as: ? Liquid around the nipples. ? Bleeding. ? Dimpling. ? Redness. ? A change in where the nipples are. Feel for changes  1. Lie on your back on the floor. 2. Feel each breast. To do this, follow these steps: ? Pick a breast to feel. ? Put the arm closest to that breast above your head. ? Use your other arm to feel the nipple area of your breast. Feel the area with the pads of your three middle fingers by making small circles with your fingers. For the first circle, press lightly. For the second  circle, press harder. For the third circle, press even harder. ? Keep making circles with your fingers at the different pressures as you move down your breast. Stop when you feel your ribs. ? Move your fingers a little toward the center of your body. ? Start making circles with your fingers again, this time going up until you reach your collarbone. ? Keep making up-and-down circles until you reach your armpit. Remember to keep using the three pressures. ? Feel the other breast in the same way. 3. Sit or stand in the tub or shower. 4. With soapy water on your skin, feel each breast the same way you did in step 2 when you were lying on the floor. Write down what you find Writing down what you find can help you remember what to tell your doctor. Write down:  What is normal for each breast.  Any changes you find in each breast, including: ? The kind of changes you find. ? Whether you have pain. ? Size and location of any lumps.  When you last had your menstrual period. General tips  Check your breasts every month.  If you are breastfeeding, the best time to check your breasts is after you feed your baby or after you use a breast pump.  If you get menstrual periods, the best time to check your breasts is 5-7 days  after your menstrual period is over.  With time, you will become comfortable with the self-exam, and you will begin to know if there are changes in your breasts. Contact a doctor if you:  See a change in the shape or size of your breasts or nipples.  See a change in the skin of your breast or nipples, such as red or scaly skin.  Have fluid coming from your nipples that is not normal.  Find a lump or thick area that was not there before.  Have pain in your breasts.  Have any concerns about your breast health. Summary  Breast self-awareness includes looking for changes in your breasts, as well as feeling for changes within your breasts.  Breast self-awareness should be  done in front of a mirror in a well-lit room.  You should check your breasts every month. If you get menstrual periods, the best time to check your breasts is 5-7 days after your menstrual period is over.  Let your doctor know of any changes you see in your breasts, including changes in size, changes on the skin, pain or tenderness, or fluid from your nipples that is not normal. This information is not intended to replace advice given to you by your health care provider. Make sure you discuss any questions you have with your health care provider. Document Revised: 05/16/2018 Document Reviewed: 05/16/2018 Elsevier Patient Education  [employer].

## 2020-11-23 ENCOUNTER — Other Ambulatory Visit: Payer: Self-pay | Admitting: Oncology

## 2020-11-23 DIAGNOSIS — C50211 Malignant neoplasm of upper-inner quadrant of right female breast: Secondary | ICD-10-CM

## 2020-11-23 DIAGNOSIS — Z170 Estrogen receptor positive status [ER+]: Secondary | ICD-10-CM

## 2020-12-15 ENCOUNTER — Ambulatory Visit
Admission: RE | Admit: 2020-12-15 | Discharge: 2020-12-15 | Disposition: A | Discharge status: Home / Self Care | Payer: Managed Care, Other (non HMO) | Source: Ambulatory Visit | Attending: Radiation Oncology | Admitting: Radiation Oncology

## 2020-12-15 ENCOUNTER — Other Ambulatory Visit: Payer: Self-pay

## 2020-12-15 ENCOUNTER — Encounter: Payer: Self-pay | Admitting: Radiation Oncology

## 2020-12-15 VITALS — BP 111/73 | HR 79 | Temp 96.8°F | Wt 153.6 lb

## 2020-12-15 DIAGNOSIS — Z170 Estrogen receptor positive status [ER+]: Secondary | ICD-10-CM

## 2020-12-15 DIAGNOSIS — C50211 Malignant neoplasm of upper-inner quadrant of right female breast: Secondary | ICD-10-CM

## 2020-12-15 NOTE — Progress Notes (Signed)
Radiation Oncology Follow up Note  Name: Carol Espinoza   Date:   12/15/2020 MRN:  [id number] DOB: 06-14-1961    This 60 y.o. female presents to the clinic today for 1 year follow-up status post whole breast radiation to her right breast for ER/PR positive stage I invasive mammary carcinoma.  REFERRING PROVIDER: No ref. provider found  HPI: Patient is a 60 year old female now at 1 year having completed whole breast radiation to her right breast for ER/PR positive stage I invasive mammary carcinoma.  Seen today in routine follow-up she is doing well.  She specifically denies breast tenderness cough or bone pain..  She is currently on letrozole tolerating it well without side effect.  She had mammograms back in November which I have reviewed were BI-RADS 2 benign.  COMPLICATIONS OF TREATMENT: none  FOLLOW UP COMPLIANCE: keeps appointments   PHYSICAL EXAM:  BP 111/73   Pulse 79   Temp (!) 96.8 F (36 C) (Tympanic)   Wt 153 lb 9.6 oz (69.7 kg)   LMP 05/11/2017 (Approximate) Comment: 1 time bleeding 12/2017 after d/c pill  BMI 30.00 kg/m  Lungs are clear to A&P cardiac examination essentially unremarkable with regular rate and rhythm. No dominant mass or nodularity is noted in either breast in 2 positions examined. Incision is well-healed. No axillary or supraclavicular adenopathy is appreciated. Cosmetic result is excellent.  Well-developed well-nourished patient in NAD. HEENT reveals PERLA, EOMI, discs not visualized.  Oral cavity is clear. No oral mucosal lesions are identified. Neck is clear without evidence of cervical or supraclavicular adenopathy. Lungs are clear to A&P. Cardiac examination is essentially unremarkable with regular rate and rhythm without murmur rub or thrill. Abdomen is benign with no organomegaly or masses noted. Motor sensory and DTR levels are equal and symmetric in the upper and lower extremities. Cranial nerves II through XII are grossly intact. Proprioception is  intact. No peripheral adenopathy or edema is identified. No motor or sensory levels are noted. Crude visual fields are within normal range.  RADIOLOGY RESULTS: Mammograms reviewed compatible with above-stated findings  PLAN: Present time patient is doing well with no evidence of disease 1 year out and pleased with her overall progress.  Of asked to see her back in 1 year for follow-up.  She continues on letrozole without side effect.  Patient knows to call with any concerns at any time.  I would like to take this opportunity to thank you for allowing me to participate in the care of your patient.Noreene Filbert, MD

## 2021-02-09 ENCOUNTER — Ambulatory Visit
Admission: RE | Admit: 2021-02-09 | Discharge: 2021-02-09 | Disposition: A | Discharge status: Home / Self Care | Payer: 59 | Source: Ambulatory Visit | Attending: Oncology | Admitting: Oncology

## 2021-02-09 ENCOUNTER — Other Ambulatory Visit: Payer: Self-pay

## 2021-02-09 DIAGNOSIS — Z131 Encounter for screening for diabetes mellitus: Secondary | ICD-10-CM | POA: Diagnosis not present

## 2021-02-09 DIAGNOSIS — M25569 Pain in unspecified knee: Secondary | ICD-10-CM | POA: Diagnosis not present

## 2021-02-09 DIAGNOSIS — C50211 Malignant neoplasm of upper-inner quadrant of right female breast: Secondary | ICD-10-CM | POA: Diagnosis not present

## 2021-02-09 DIAGNOSIS — Z79811 Long term (current) use of aromatase inhibitors: Secondary | ICD-10-CM | POA: Diagnosis not present

## 2021-02-09 DIAGNOSIS — Z13820 Encounter for screening for osteoporosis: Secondary | ICD-10-CM | POA: Diagnosis not present

## 2021-02-09 DIAGNOSIS — Z1329 Encounter for screening for other suspected endocrine disorder: Secondary | ICD-10-CM | POA: Diagnosis not present

## 2021-02-09 DIAGNOSIS — E782 Mixed hyperlipidemia: Secondary | ICD-10-CM | POA: Diagnosis not present

## 2021-02-09 DIAGNOSIS — Z0000 Encounter for general adult medical examination without abnormal findings: Secondary | ICD-10-CM | POA: Diagnosis not present

## 2021-02-09 DIAGNOSIS — M8589 Other specified disorders of bone density and structure, multiple sites: Secondary | ICD-10-CM | POA: Diagnosis not present

## 2021-02-09 DIAGNOSIS — R002 Palpitations: Secondary | ICD-10-CM | POA: Diagnosis not present

## 2021-02-09 DIAGNOSIS — R69 Illness, unspecified: Secondary | ICD-10-CM | POA: Diagnosis not present

## 2021-02-09 DIAGNOSIS — Z170 Estrogen receptor positive status [ER+]: Secondary | ICD-10-CM | POA: Insufficient documentation

## 2021-02-18 DIAGNOSIS — M25562 Pain in left knee: Secondary | ICD-10-CM | POA: Diagnosis not present

## 2021-02-18 DIAGNOSIS — M222X1 Patellofemoral disorders, right knee: Secondary | ICD-10-CM | POA: Diagnosis not present

## 2021-02-18 DIAGNOSIS — M222X2 Patellofemoral disorders, left knee: Secondary | ICD-10-CM | POA: Diagnosis not present

## 2021-02-18 DIAGNOSIS — G8929 Other chronic pain: Secondary | ICD-10-CM | POA: Diagnosis not present

## 2021-02-25 ENCOUNTER — Ambulatory Visit: Payer: 59 | Admitting: Surgery

## 2021-02-25 ENCOUNTER — Other Ambulatory Visit: Payer: Self-pay

## 2021-02-25 ENCOUNTER — Encounter: Payer: Self-pay | Admitting: Surgery

## 2021-02-25 VITALS — BP 108/70 | HR 83 | Temp 98.0°F | Ht 62.0 in | Wt 153.6 lb

## 2021-02-25 DIAGNOSIS — Z170 Estrogen receptor positive status [ER+]: Secondary | ICD-10-CM

## 2021-02-25 DIAGNOSIS — C50211 Malignant neoplasm of upper-inner quadrant of right female breast: Secondary | ICD-10-CM

## 2021-02-25 NOTE — Patient Instructions (Addendum)
We will see you back in 6 months with getting a Mammo prior to visit. If you have not heard from anyone by the end of October, please call our office.   If you have any concerns or questions, please feel free to call our office.
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
## 2021-03-02 ENCOUNTER — Other Ambulatory Visit: Payer: Self-pay

## 2021-03-02 ENCOUNTER — Inpatient Hospital Stay: Payer: 59 | Attending: Oncology | Admitting: Oncology

## 2021-03-02 DIAGNOSIS — C50211 Malignant neoplasm of upper-inner quadrant of right female breast: Secondary | ICD-10-CM

## 2021-03-02 DIAGNOSIS — Z170 Estrogen receptor positive status [ER+]: Secondary | ICD-10-CM
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
## 2021-03-20 ENCOUNTER — Telehealth: Payer: Self-pay | Admitting: Oncology

## 2021-03-20 NOTE — Telephone Encounter (Signed)
Pt left a VM stating she felt like she did not need to keep her appt with you. She continued her letrozole and feels much better.

## 2021-03-24 DIAGNOSIS — R946 Abnormal results of thyroid function studies: Secondary | ICD-10-CM | POA: Diagnosis not present

## 2021-03-24 DIAGNOSIS — R7989 Other specified abnormal findings of blood chemistry: Secondary | ICD-10-CM | POA: Diagnosis not present

## 2021-03-25 ENCOUNTER — Inpatient Hospital Stay: Payer: 59 | Admitting: Oncology

## 2021-04-01 DIAGNOSIS — L821 Other seborrheic keratosis: Secondary | ICD-10-CM | POA: Diagnosis not present

## 2021-04-01 DIAGNOSIS — M6740 Ganglion, unspecified site: Secondary | ICD-10-CM | POA: Diagnosis not present

## 2021-04-01 DIAGNOSIS — D1801 Hemangioma of skin and subcutaneous tissue: Secondary | ICD-10-CM | POA: Diagnosis not present

## 2021-04-01 DIAGNOSIS — L578 Other skin changes due to chronic exposure to nonionizing radiation: Secondary | ICD-10-CM | POA: Diagnosis not present

## 2021-04-01 DIAGNOSIS — L570 Actinic keratosis: Secondary | ICD-10-CM | POA: Diagnosis not present

## 2021-04-01 DIAGNOSIS — L720 Epidermal cyst: Secondary | ICD-10-CM | POA: Diagnosis not present

## 2021-04-01 DIAGNOSIS — Z86018 Personal history of other benign neoplasm: Secondary | ICD-10-CM | POA: Diagnosis not present

## 2021-05-06 DIAGNOSIS — E039 Hypothyroidism, unspecified: Secondary | ICD-10-CM | POA: Diagnosis not present

## 2021-06-22 IMAGING — MG MM PLC BREAST LOC DEV 1ST LESION INC*R*
6 series · 6 of 6 positions shown · non-contrast
Comparison: Previous exams.

CLINICAL DATA: Biopsy proven invasive mammary carcinoma of the
right breast. Needle localization requested prior to surgery.

EXAM:
NEEDLE LOCALIZATION OF THE RIGHT BREAST WITH MAMMO GUIDANCE

[R ML (1 of 2)]
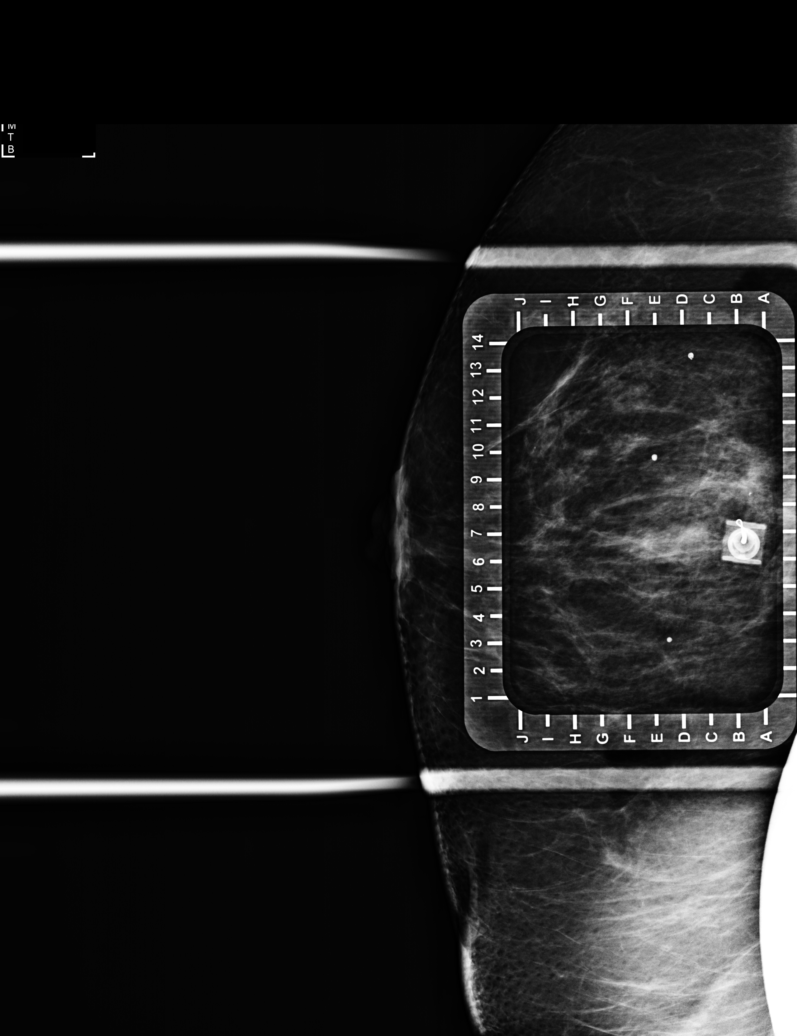

[R CC (1 of 4)]
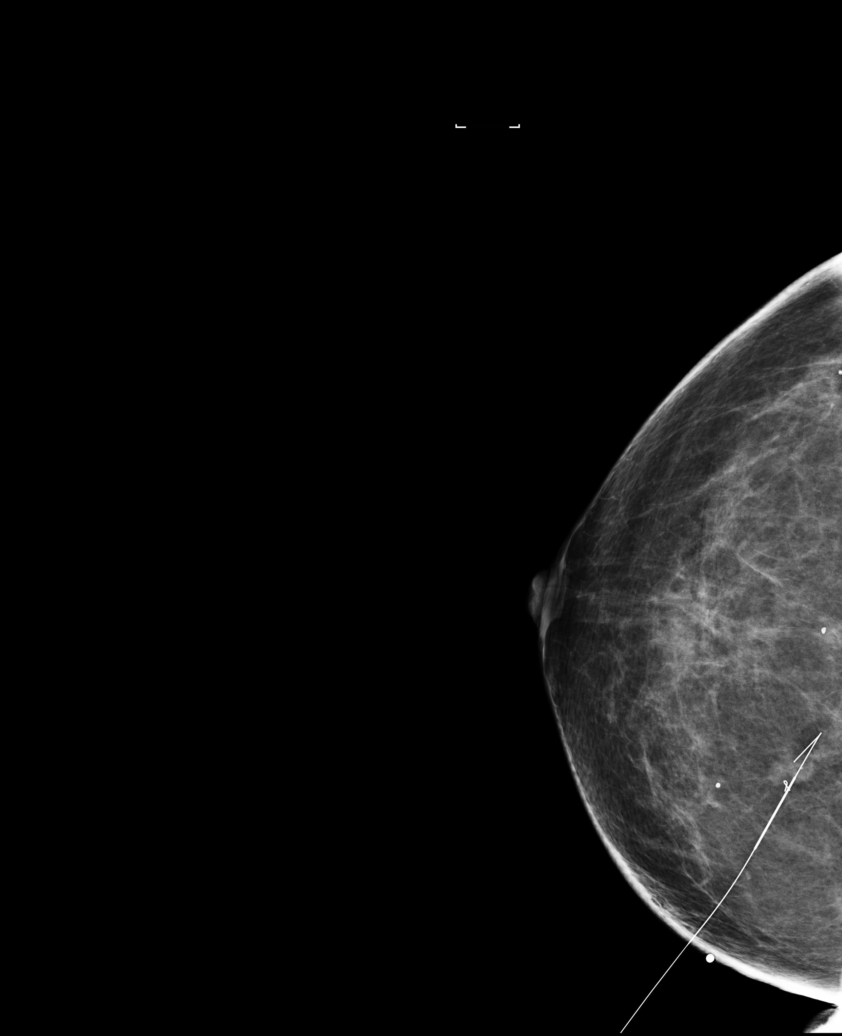

[R CC (2 of 4)]
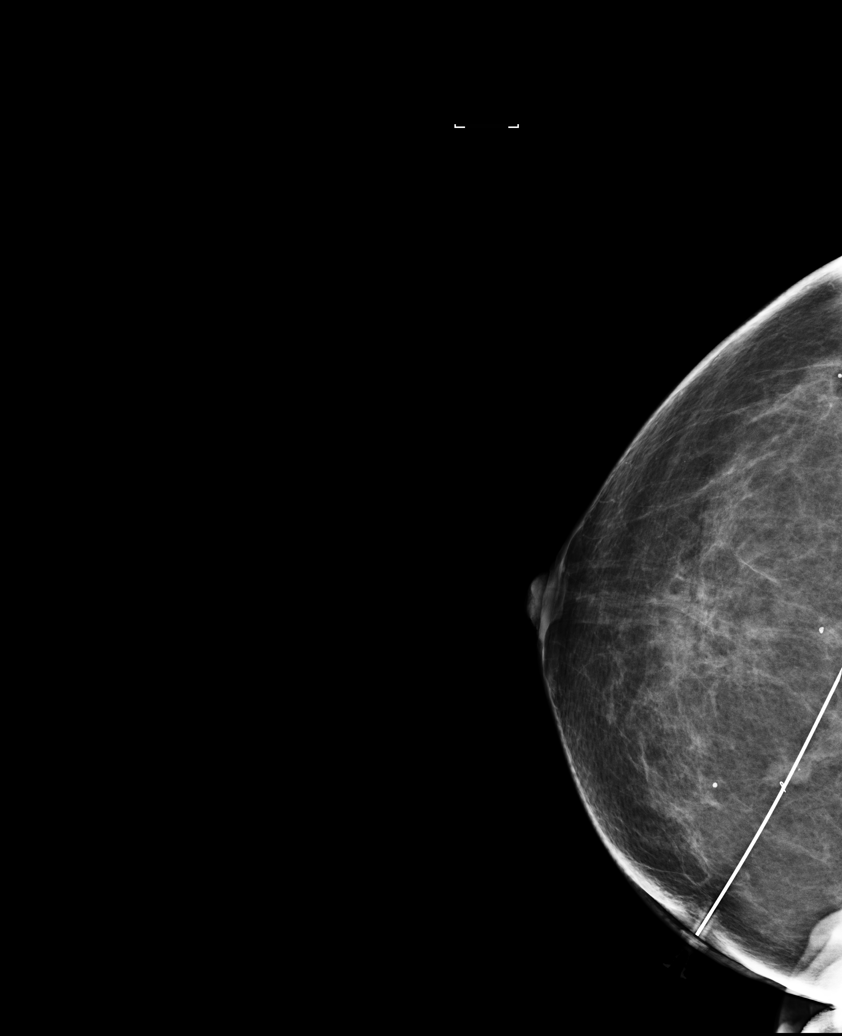

[R CC (3 of 4)]
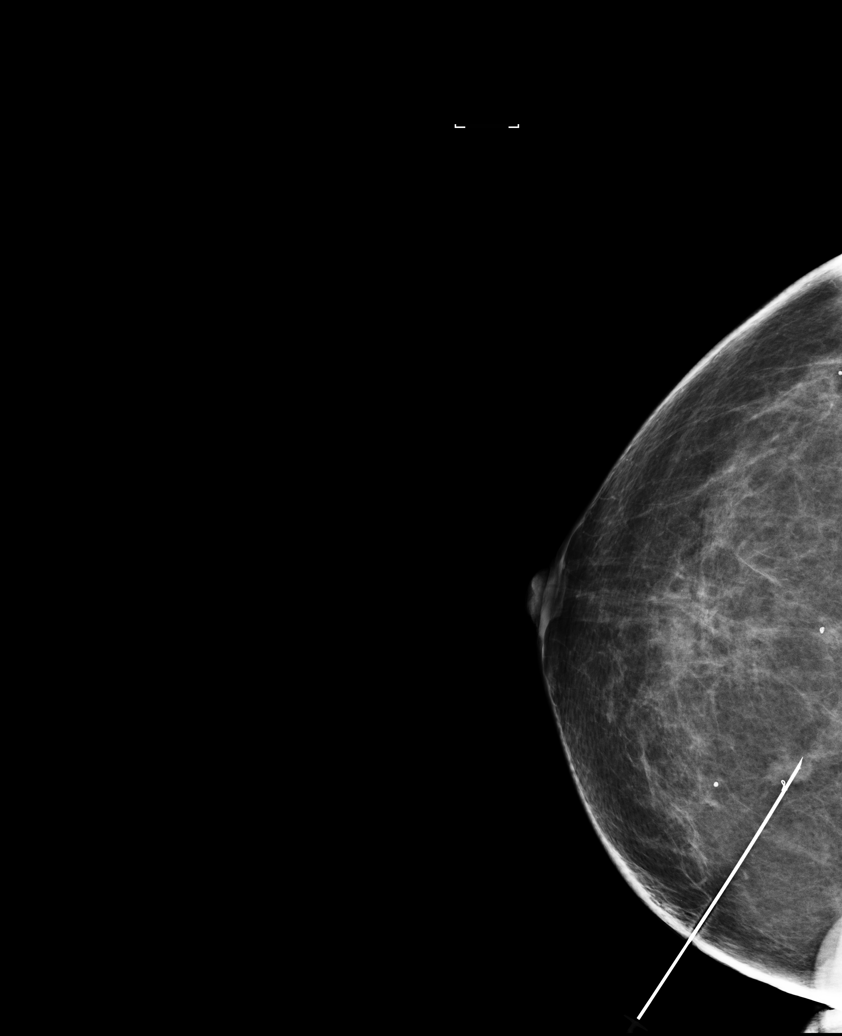

[R ML (2 of 2)]
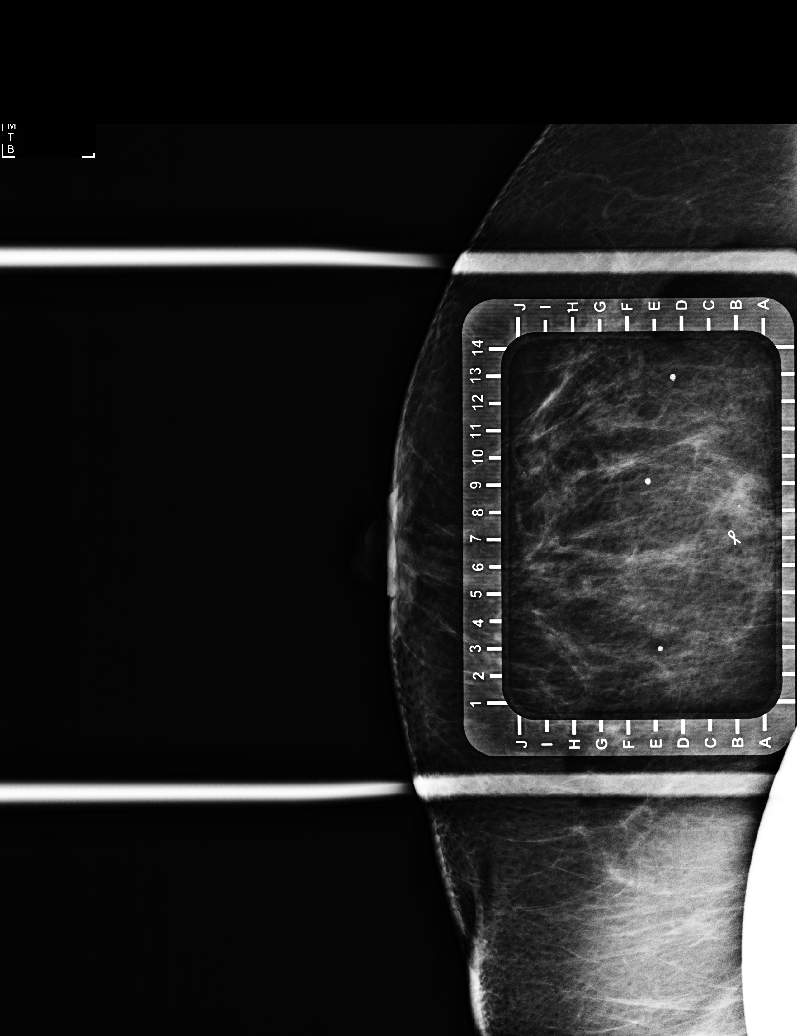

[R CC (4 of 4)]
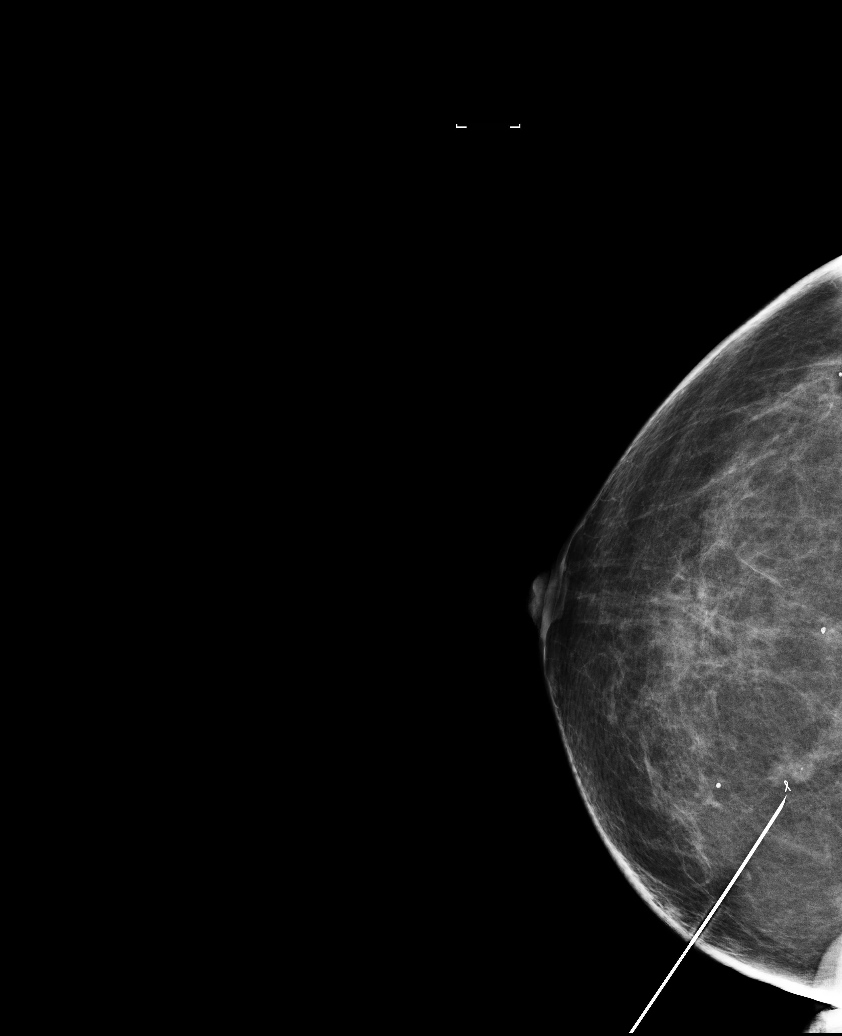

[6 of 6 positions shown; findings below may reference images not displayed]

FINDINGS: Patient presents for needle localization prior to surgery. I met
with the patient and we discussed the procedure of needle
localization including benefits and alternatives. We discussed the
high likelihood of a successful procedure. We discussed the risks of
the procedure, including infection, bleeding, tissue injury, and
further surgery. Informed, written consent was given. The usual
time-out protocol was performed immediately prior to the procedure.

Using mammographic guidance, sterile technique, 1% lidocaine and a
#7 modified Kopans needle, the ribbon shaped clip was localized
using medial to lateral approach. The images were marked for Dr.
Niaz.
IMPRESSION: Needle localization of the right breast. No apparent complications.

## 2021-06-22 IMAGING — MG MM BREAST SURGICAL SPECIMEN
1 series · 1 of 1 positions shown · non-contrast
Comparison: Previous exam(s).

CLINICAL DATA: Status post surgical excision of a biopsy proven
invasive mammary carcinoma in the right breast.

EXAM:
SPECIMEN RADIOGRAPH OF THE RIGHT BREAST

[R SPECIMEN]
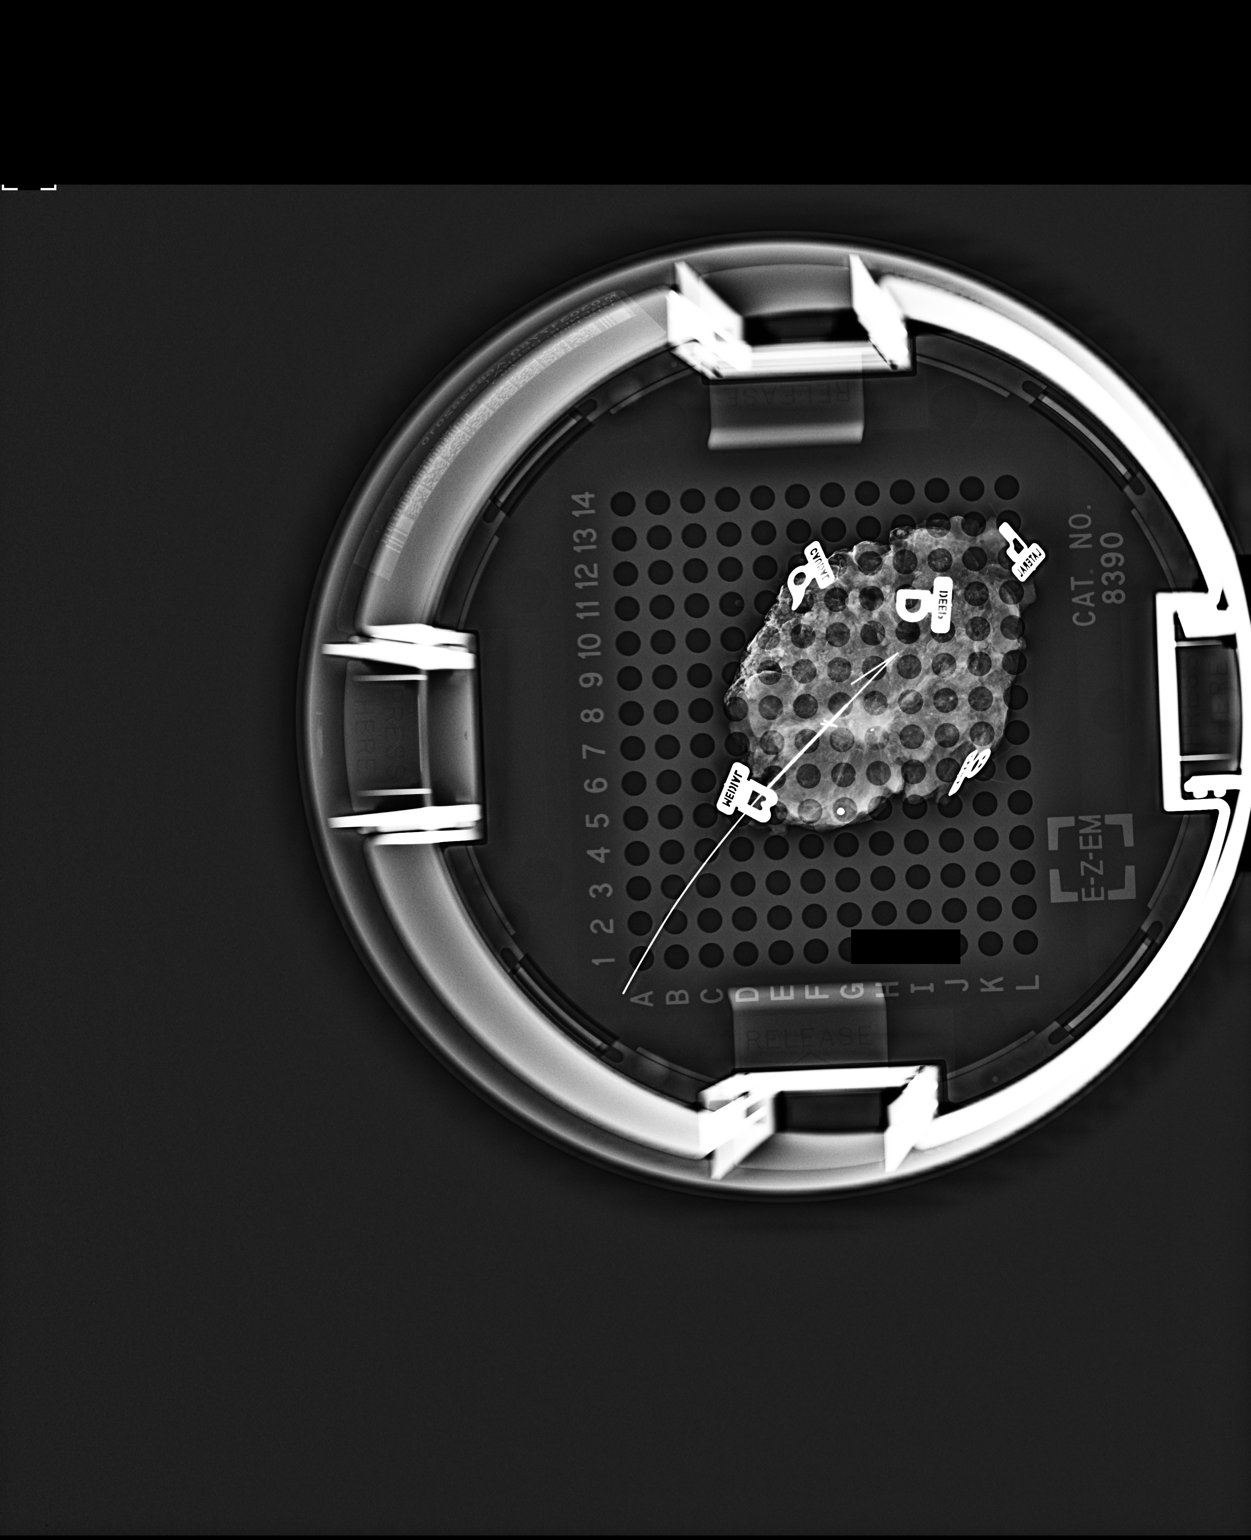

[1 of 1 positions shown; findings below may reference images not displayed]

FINDINGS: Status post excision of the right breast. The wire tip and biopsy
marker clip are present and are marked for pathology.
IMPRESSION: Specimen radiograph of the right breast.

## 2021-07-08 ENCOUNTER — Other Ambulatory Visit: Payer: Self-pay

## 2021-07-08 DIAGNOSIS — Z170 Estrogen receptor positive status [ER+]: Secondary | ICD-10-CM

## 2021-07-08 DIAGNOSIS — C50211 Malignant neoplasm of upper-inner quadrant of right female breast: Secondary | ICD-10-CM

## 2021-08-27 ENCOUNTER — Ambulatory Visit
Admission: RE | Admit: 2021-08-27 | Discharge: 2021-08-27 | Disposition: A | Discharge status: Home / Self Care | Payer: 59 | Source: Ambulatory Visit | Attending: Surgery | Admitting: Surgery

## 2021-08-27 ENCOUNTER — Other Ambulatory Visit: Payer: Self-pay

## 2021-08-27 DIAGNOSIS — Z170 Estrogen receptor positive status [ER+]: Secondary | ICD-10-CM | POA: Diagnosis present

## 2021-08-27 DIAGNOSIS — C50211 Malignant neoplasm of upper-inner quadrant of right female breast: Secondary | ICD-10-CM | POA: Insufficient documentation

## 2021-09-02 ENCOUNTER — Other Ambulatory Visit: Payer: Self-pay

## 2021-09-02 ENCOUNTER — Ambulatory Visit: Payer: 59 | Admitting: Surgery

## 2021-09-02 ENCOUNTER — Encounter: Payer: Self-pay | Admitting: Surgery

## 2021-09-02 VITALS — BP 110/74 | HR 80 | Temp 98.0°F | Ht 62.0 in | Wt 146.0 lb

## 2021-09-02 DIAGNOSIS — C50211 Malignant neoplasm of upper-inner quadrant of right female breast: Secondary | ICD-10-CM | POA: Diagnosis not present

## 2021-09-02 DIAGNOSIS — Z170 Estrogen receptor positive status [ER+]: Secondary | ICD-10-CM | POA: Diagnosis not present

## 2021-09-02 NOTE — Patient Instructions (Addendum)
Patient will be asked to return to the office in one year with a bilateral screening mammogram.  Continue self breast exams. Call office for any new breast issues or concerns.  
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
## 2021-10-10 ENCOUNTER — Other Ambulatory Visit: Payer: Self-pay | Admitting: Oncology

## 2021-10-10 DIAGNOSIS — C50211 Malignant neoplasm of upper-inner quadrant of right female breast: Secondary | ICD-10-CM

## 2021-12-17 ENCOUNTER — Encounter: Payer: Self-pay | Admitting: Radiation Oncology

## 2021-12-17 ENCOUNTER — Other Ambulatory Visit: Payer: Self-pay

## 2021-12-17 ENCOUNTER — Ambulatory Visit
Admission: RE | Admit: 2021-12-17 | Discharge: 2021-12-17 | Disposition: A | Discharge status: Home / Self Care | Payer: Self-pay | Source: Ambulatory Visit | Attending: Radiation Oncology | Admitting: Radiation Oncology

## 2021-12-17 VITALS — BP 118/75 | HR 85 | Temp 98.7°F | Resp 19 | Wt 147.2 lb

## 2021-12-17 DIAGNOSIS — C50211 Malignant neoplasm of upper-inner quadrant of right female breast: Secondary | ICD-10-CM | POA: Insufficient documentation

## 2021-12-17 DIAGNOSIS — Z923 Personal history of irradiation: Secondary | ICD-10-CM | POA: Insufficient documentation

## 2021-12-17 DIAGNOSIS — Z170 Estrogen receptor positive status [ER+]: Secondary | ICD-10-CM | POA: Insufficient documentation

## 2021-12-17 DIAGNOSIS — Z79811 Long term (current) use of aromatase inhibitors: Secondary | ICD-10-CM | POA: Insufficient documentation

## 2021-12-17 NOTE — Progress Notes (Signed)
Radiation Oncology ?Follow up Note ? ?Name: Carol Espinoza   ?Date:   12/17/2021 ?MRN:  [id number] ?DOB: [birth year]  ? ? ?This 61 y.o. female presents to the clinic today for 2-year follow-up status post whole breast radiation to her right breast for stage I ER/PR positive invasive mammary carcinoma. ? ?REFERRING PROVIDER: Latanya Maudlin, NP ? ?HPI: Patient is a 61 year old female now at 2 years having completed whole breast radiation to her right breast for ER/PR positive stage I invasive mammary carcinoma.  Seen today in routine follow-up she is doing well.  She specifically denies breast tenderness cough or bone pain..  She had mammograms back in November which I have reviewed were BI-RADS 2 benign.  She is currently on Femara tolerating that well without side effect. ? ?COMPLICATIONS OF TREATMENT: none ? ?FOLLOW UP COMPLIANCE: keeps appointments  ? ?PHYSICAL EXAM:  ?BP 118/75   Pulse 85   Temp 98.7 ?F (37.1 ?C)   Resp 19   Wt 147 lb 3.2 oz (66.8 kg)   LMP 05/11/2017 (Approximate) Comment: 1 time bleeding 12/2017 after d/c pill  SpO2 99%   BMI 26.92 kg/m?  ?Lungs are clear to A&P cardiac examination essentially unremarkable with regular rate and rhythm. No dominant mass or nodularity is noted in either breast in 2 positions examined. Incision is well-healed. No axillary or supraclavicular adenopathy is appreciated. Cosmetic result is excellent.  Well-developed well-nourished patient in NAD. HEENT reveals PERLA, EOMI, discs not visualized.  Oral cavity is clear. No oral mucosal lesions are identified. Neck is clear without evidence of cervical or supraclavicular adenopathy. Lungs are clear to A&P. Cardiac examination is essentially unremarkable with regular rate and rhythm without murmur rub or thrill. Abdomen is benign with no organomegaly or masses noted. Motor sensory and DTR levels are equal and symmetric in the upper and lower extremities. Cranial nerves II through XII are grossly intact. Proprioception  is intact. No peripheral adenopathy or edema is identified. No motor or sensory levels are noted. Crude visual fields are within normal range. ? ?RADIOLOGY RESULTS: Mammograms reviewed compatible with above-stated findings ? ?PLAN: Present time she is now out over 2 years with no evidence of disease.  And pleased with her overall progress.  I have asked to see her back in 1 year for follow-up.  Patient is to call with any concerns. ? ?I would like to take this opportunity to thank you for allowing me to participate in the care of your patient.. ?  ? Noreene Filbert, MD ? ?
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
## 2022-04-01 ENCOUNTER — Inpatient Hospital Stay: Payer: BC Managed Care – PPO | Attending: Oncology | Admitting: Oncology

## 2022-04-01 ENCOUNTER — Encounter: Payer: Self-pay | Admitting: Oncology

## 2022-04-01 VITALS — BP 100/61 | HR 78 | Temp 96.7°F | Resp 16 | Ht 62.0 in | Wt 149.6 lb

## 2022-04-01 DIAGNOSIS — Z170 Estrogen receptor positive status [ER+]: Secondary | ICD-10-CM | POA: Insufficient documentation

## 2022-04-01 DIAGNOSIS — M8580 Other specified disorders of bone density and structure, unspecified site: Secondary | ICD-10-CM | POA: Insufficient documentation

## 2022-04-01 DIAGNOSIS — C50211 Malignant neoplasm of upper-inner quadrant of right female breast: Secondary | ICD-10-CM | POA: Diagnosis present

## 2022-04-01 DIAGNOSIS — Z8049 Family history of malignant neoplasm of other genital organs: Secondary | ICD-10-CM | POA: Diagnosis not present

## 2022-04-01 DIAGNOSIS — Z800 Family history of malignant neoplasm of digestive organs: Secondary | ICD-10-CM | POA: Diagnosis not present

## 2022-04-01 DIAGNOSIS — Z808 Family history of malignant neoplasm of other organs or systems: Secondary | ICD-10-CM | POA: Diagnosis not present

## 2022-04-01 DIAGNOSIS — Z79811 Long term (current) use of aromatase inhibitors: Secondary | ICD-10-CM | POA: Diagnosis not present

## 2022-04-01 DIAGNOSIS — Z923 Personal history of irradiation: Secondary | ICD-10-CM | POA: Insufficient documentation

## 2022-05-21 ENCOUNTER — Ambulatory Visit
Admission: RE | Admit: 2022-05-21 | Discharge: 2022-05-21 | Disposition: A | Discharge status: Home / Self Care | Payer: BC Managed Care – PPO | Source: Ambulatory Visit | Attending: Oncology | Admitting: Oncology

## 2022-05-21 DIAGNOSIS — Z170 Estrogen receptor positive status [ER+]: Secondary | ICD-10-CM | POA: Diagnosis present

## 2022-05-21 DIAGNOSIS — Z79811 Long term (current) use of aromatase inhibitors: Secondary | ICD-10-CM | POA: Insufficient documentation

## 2022-05-21 DIAGNOSIS — C50211 Malignant neoplasm of upper-inner quadrant of right female breast: Secondary | ICD-10-CM | POA: Diagnosis not present

## 2022-08-30 ENCOUNTER — Ambulatory Visit
Admission: RE | Admit: 2022-08-30 | Discharge: 2022-08-30 | Disposition: A | Discharge status: Home / Self Care | Payer: BC Managed Care – PPO | Source: Ambulatory Visit | Attending: Oncology | Admitting: Oncology

## 2022-08-30 DIAGNOSIS — Z1231 Encounter for screening mammogram for malignant neoplasm of breast: Secondary | ICD-10-CM | POA: Diagnosis present

## 2022-08-30 DIAGNOSIS — Z79811 Long term (current) use of aromatase inhibitors: Secondary | ICD-10-CM | POA: Diagnosis not present

## 2022-08-30 DIAGNOSIS — Z853 Personal history of malignant neoplasm of breast: Secondary | ICD-10-CM | POA: Diagnosis not present

## 2022-08-30 DIAGNOSIS — C50211 Malignant neoplasm of upper-inner quadrant of right female breast: Secondary | ICD-10-CM

## 2022-09-01 ENCOUNTER — Other Ambulatory Visit: Payer: Self-pay | Admitting: Oncology

## 2022-09-01 DIAGNOSIS — Z170 Estrogen receptor positive status [ER+]: Secondary | ICD-10-CM

## 2022-09-08 ENCOUNTER — Ambulatory Visit: Payer: BC Managed Care – PPO | Admitting: Surgery

## 2022-09-08 ENCOUNTER — Encounter: Payer: Self-pay | Admitting: Surgery

## 2022-09-08 VITALS — BP 117/76 | HR 88 | Temp 97.8°F | Wt 154.0 lb

## 2022-09-08 DIAGNOSIS — Z170 Estrogen receptor positive status [ER+]: Secondary | ICD-10-CM | POA: Diagnosis not present

## 2022-09-08 DIAGNOSIS — C50211 Malignant neoplasm of upper-inner quadrant of right female breast: Secondary | ICD-10-CM | POA: Diagnosis not present

## 2022-09-08 DIAGNOSIS — Z08 Encounter for follow-up examination after completed treatment for malignant neoplasm: Secondary | ICD-10-CM

## 2022-09-08 NOTE — Patient Instructions (Signed)
If you have any concerns or questions, please feel free to call our office.   Breast Self-Awareness Breast self-awareness is knowing how your breasts look and feel. You need to: Check your breasts on a regular basis. Tell your doctor about any changes. Become familiar with the look and feel of your breasts. This can help you catch a breast problem while it is still small and can be treated. You should do breast self-exams even if you have breast implants. What you need: A mirror. A well-lit room. A pillow or other soft object. How to do a breast self-exam Follow these steps to do a breast self-exam: Look for changes  Take off all the clothes above your waist. Stand in front of a mirror in a room with good lighting. Put your hands down at your sides. Compare your breasts in the mirror. Look for any difference between them, such as: A difference in shape. A difference in size. Wrinkles, dips, and bumps in one breast and not the other. Look at each breast for changes in the skin, such as: Redness. Scaly areas. Skin that has gotten thicker. Dimpling. Open sores (ulcers). Look for changes in your nipples, such as: Fluid coming out of a nipple. Fluid around a nipple. Bleeding. Dimpling. Redness. A nipple that looks pushed in (retracted), or that has changed position. Feel for changes Lie on your back. Feel each breast. To do this: Pick a breast to feel. Place a pillow under the shoulder closest to that breast. Put the arm closest to that breast behind your head. Feel the nipple area of that breast using the hand of your other arm. Feel the area with the pads of your three middle fingers by making small circles with your fingers. Use light, medium, and firm pressure. Continue the overlapping circles, moving downward over the breast. Keep making circles with your fingers. Stop when you feel your ribs. Start making circles with your fingers again, this time going upward until you  reach your collarbone. Then, make circles outward across your breast and into your armpit area. Squeeze your nipple. Check for discharge and lumps. Repeat these steps to check your other breast. Sit or stand in the tub or shower. With soapy water on your skin, feel each breast the same way you did when you were lying down. Write down what you find Writing down what you find can help you remember what to tell your doctor. Write down: What is normal for each breast. Any changes you find in each breast. These include: The kind of changes you find. A tender or painful breast. Any lump you find. Write down its size and where it is. When you last had your monthly period (menstrual cycle). General tips If you are breastfeeding, the best time to check your breasts is after you feed your baby or after you use a breast pump. If you get monthly bleeding, the best time to check your breasts is 5-7 days after your monthly cycle ends. With time, you will become comfortable with the self-exam. You will also start to know if there are changes in your breasts. Contact a doctor if: You see a change in the shape or size of your breasts or nipples. You see a change in the skin of your breast or nipples, such as red or scaly skin. You have fluid coming from your nipples that is not normal. You find a new lump or thick area. You have breast pain. You have any concerns about your   breast health. Summary Breast self-awareness includes looking for changes in your breasts and feeling for changes within your breasts. You should do breast self-awareness in front of a mirror in a well-lit room. If you get monthly periods (menstrual cycles), the best time to check your breasts is 5-7 days after your period ends. Tell your doctor about any changes you see in your breasts. Changes include changes in size, changes on the skin, painful or tender breasts, or fluid from your nipples that is not normal. This information is  not intended to replace advice given to you by your health care provider. Make sure you discuss any questions you have with your health care provider. Document Revised: 03/04/2022 Document Reviewed: 07/30/2021 Elsevier Patient Education  2023 [employer]  
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
## 2022-09-28 ENCOUNTER — Encounter: Payer: Self-pay | Admitting: Oncology

## 2022-09-28 ENCOUNTER — Inpatient Hospital Stay: Payer: BC Managed Care – PPO | Attending: Oncology | Admitting: Oncology

## 2022-09-28 ENCOUNTER — Ambulatory Visit: Payer: BC Managed Care – PPO | Admitting: Oncology

## 2022-09-28 VITALS — BP 118/53 | HR 81 | Temp 98.4°F | Resp 16 | Ht 62.0 in | Wt 155.6 lb

## 2022-09-28 DIAGNOSIS — Z170 Estrogen receptor positive status [ER+]: Secondary | ICD-10-CM | POA: Insufficient documentation

## 2022-09-28 DIAGNOSIS — C50211 Malignant neoplasm of upper-inner quadrant of right female breast: Secondary | ICD-10-CM | POA: Insufficient documentation

## 2022-09-28 DIAGNOSIS — Z79811 Long term (current) use of aromatase inhibitors: Secondary | ICD-10-CM | POA: Insufficient documentation

## 2022-09-28 DIAGNOSIS — M8580 Other specified disorders of bone density and structure, unspecified site: Secondary | ICD-10-CM | POA: Insufficient documentation
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
## 2022-12-23 ENCOUNTER — Ambulatory Visit
Admission: RE | Admit: 2022-12-23 | Discharge: 2022-12-23 | Disposition: A | Discharge status: Home / Self Care | Payer: BC Managed Care – PPO | Source: Ambulatory Visit | Attending: Radiation Oncology | Admitting: Radiation Oncology

## 2022-12-23 ENCOUNTER — Encounter: Payer: Self-pay | Admitting: Radiation Oncology

## 2022-12-23 VITALS — BP 121/84 | HR 90 | Temp 98.3°F | Resp 16 | Wt 151.6 lb

## 2022-12-23 DIAGNOSIS — C50211 Malignant neoplasm of upper-inner quadrant of right female breast: Secondary | ICD-10-CM | POA: Diagnosis present

## 2022-12-23 DIAGNOSIS — Z170 Estrogen receptor positive status [ER+]: Secondary | ICD-10-CM

## 2022-12-23 DIAGNOSIS — Z923 Personal history of irradiation: Secondary | ICD-10-CM | POA: Insufficient documentation

## 2022-12-23 DIAGNOSIS — Z79811 Long term (current) use of aromatase inhibitors: Secondary | ICD-10-CM | POA: Insufficient documentation

## 2022-12-23 NOTE — Progress Notes (Signed)
Radiation Oncology Follow up Note  Name: Carol Espinoza   Date:   12/23/2022 MRN:  [id number] DOB: 05/17/1961    This 62 y.o. female presents to the clinic today for 3-year follow-up status post whole breast radiation to her right breast for stage I ER positive invasive mammary carcinoma.  REFERRING PROVIDER: Latanya Maudlin, NP  HPI: Patient is a 62 year old female now out over 3 years having completed whole breast radiation to her right breast for stage I ER/PR positive invasive mammary carcinoma.  Seen today in routine follow-up she is doing well.  She specifically denies breast tenderness cough or bone pain..  She had mammograms back in November which I have reviewed were BI-RADS 1 negative.  She is currently on Femara tolerating it well without side effect.  COMPLICATIONS OF TREATMENT: none  FOLLOW UP COMPLIANCE: keeps appointments   PHYSICAL EXAM:  BP 121/84 (BP Location: Right Wrist, Patient Position: Sitting, Cuff Size: Small)   Pulse 90   Temp 98.3 F (36.8 C) (Tympanic)   Resp 16   Wt 151 lb 9.6 oz (68.8 kg)   LMP 05/11/2017 (Approximate) Comment: 1 time bleeding 12/2017 after d/c pill  BMI 27.73 kg/m  Lungs are clear to A&P cardiac examination essentially unremarkable with regular rate and rhythm. No dominant mass or nodularity is noted in either breast in 2 positions examined. Incision is well-healed. No axillary or supraclavicular adenopathy is appreciated. Cosmetic result is excellent.  Well-developed well-nourished patient in NAD. HEENT reveals PERLA, EOMI, discs not visualized.  Oral cavity is clear. No oral mucosal lesions are identified. Neck is clear without evidence of cervical or supraclavicular adenopathy. Lungs are clear to A&P. Cardiac examination is essentially unremarkable with regular rate and rhythm without murmur rub or thrill. Abdomen is benign with no organomegaly or masses noted. Motor sensory and DTR levels are equal and symmetric in the upper and lower  extremities. Cranial nerves II through XII are grossly intact. Proprioception is intact. No peripheral adenopathy or edema is identified. No motor or sensory levels are noted. Crude visual fields are within normal range.  RADIOLOGY RESULTS: Mammograms reviewed compatible with above-stated findings  PLAN: Present time patient is doing well with no evidence of disease now out over 3 years I am going to turn follow-up care over to medical oncology.  I would be happy to reevaluate the patient anytime should that be indicated.  Patient is to call with any concerns.  I would like to take this opportunity to thank you for allowing me to participate in the care of your patient.Noreene Filbert, MD

## 2023-05-24 ENCOUNTER — Ambulatory Visit
Admission: RE | Admit: 2023-05-24 | Discharge: 2023-05-24 | Disposition: A | Discharge status: Home / Self Care | Payer: BC Managed Care – PPO | Source: Ambulatory Visit | Attending: Oncology | Admitting: Oncology

## 2023-05-24 ENCOUNTER — Encounter: Payer: Self-pay | Admitting: Radiology

## 2023-05-24 DIAGNOSIS — C50211 Malignant neoplasm of upper-inner quadrant of right female breast: Secondary | ICD-10-CM | POA: Insufficient documentation

## 2023-05-24 DIAGNOSIS — Z79811 Long term (current) use of aromatase inhibitors: Secondary | ICD-10-CM | POA: Insufficient documentation

## 2023-05-24 DIAGNOSIS — Z170 Estrogen receptor positive status [ER+]: Secondary | ICD-10-CM | POA: Insufficient documentation

## 2023-05-27 ENCOUNTER — Other Ambulatory Visit: Payer: Self-pay | Admitting: *Deleted

## 2023-05-27 ENCOUNTER — Inpatient Hospital Stay: Payer: BC Managed Care – PPO | Attending: Oncology | Admitting: Oncology

## 2023-05-27 ENCOUNTER — Encounter: Payer: Self-pay | Admitting: Oncology

## 2023-05-27 VITALS — BP 102/59 | HR 86 | Temp 97.8°F | Resp 16 | Ht 62.0 in | Wt 157.0 lb

## 2023-05-27 DIAGNOSIS — Z170 Estrogen receptor positive status [ER+]: Secondary | ICD-10-CM

## 2023-05-27 DIAGNOSIS — C50211 Malignant neoplasm of upper-inner quadrant of right female breast: Secondary | ICD-10-CM | POA: Insufficient documentation

## 2023-05-27 DIAGNOSIS — Z923 Personal history of irradiation: Secondary | ICD-10-CM | POA: Diagnosis not present

## 2023-05-27 DIAGNOSIS — Z79811 Long term (current) use of aromatase inhibitors: Secondary | ICD-10-CM | POA: Diagnosis not present
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
## 2023-07-17 ENCOUNTER — Other Ambulatory Visit: Payer: Self-pay | Admitting: Oncology

## 2023-07-17 DIAGNOSIS — C50211 Malignant neoplasm of upper-inner quadrant of right female breast: Secondary | ICD-10-CM

## 2023-09-01 ENCOUNTER — Ambulatory Visit
Admission: RE | Admit: 2023-09-01 | Discharge: 2023-09-01 | Disposition: A | Discharge status: Home / Self Care | Payer: BC Managed Care – PPO | Source: Ambulatory Visit | Attending: Oncology | Admitting: Oncology

## 2023-09-01 DIAGNOSIS — C50211 Malignant neoplasm of upper-inner quadrant of right female breast: Secondary | ICD-10-CM | POA: Diagnosis present

## 2023-09-01 DIAGNOSIS — Z170 Estrogen receptor positive status [ER+]: Secondary | ICD-10-CM | POA: Diagnosis present

## 2023-09-01 DIAGNOSIS — Z1231 Encounter for screening mammogram for malignant neoplasm of breast: Secondary | ICD-10-CM | POA: Diagnosis not present

## 2023-09-12 ENCOUNTER — Encounter: Payer: Self-pay | Admitting: Surgery

## 2023-09-12 ENCOUNTER — Ambulatory Visit (INDEPENDENT_AMBULATORY_CARE_PROVIDER_SITE_OTHER): Payer: BC Managed Care – PPO | Admitting: Surgery

## 2023-09-12 VITALS — BP 105/71 | HR 77 | Temp 98.1°F | Ht 61.0 in | Wt 158.0 lb

## 2023-09-12 DIAGNOSIS — Z170 Estrogen receptor positive status [ER+]: Secondary | ICD-10-CM | POA: Diagnosis not present

## 2023-09-12 DIAGNOSIS — C50211 Malignant neoplasm of upper-inner quadrant of right female breast: Secondary | ICD-10-CM

## 2023-09-12 NOTE — Patient Instructions (Signed)
Breast Self-Awareness Breast self-awareness means being familiar with how your breasts look and feel. It involves checking your breasts regularly and telling your health care provider about any changes. Practicing breast self-awareness helps to maintain breast health. Sometimes, changes are not harmful (are benign). Other times, a change in your breasts can be a sign of a serious medical problem. Being familiar with the look and feel of your breasts can help you catch a breast problem while it is still small and can be treated. You should do breast self-exams even if you have breast implants. What you need: A mirror. A well-lit room. A pillow or other soft object. How to do a breast self-exam A breast self-exam is one way to learn what is normal for your breasts and whether your breasts are changing. To do a breast self-exam: Look for changes  Remove all the clothing above your waist. Stand in front of a mirror in a room with good lighting. Put your hands down at your sides. Compare your breasts in the mirror. Look for differences between them (asymmetry), such as: Differences in shape. Differences in size. Puckers, dips, and bumps in one breast and not the other. Look at each breast for changes in the skin, such as: Redness. Scaly areas. Skin thickening. Dimpling. Open sores (ulcers). Look for changes in your nipples, such as: Discharge. Bleeding. Dimpling. Redness. A nipple that looks pushed in (retracted), or that has changed position. Feel for changes Carefully feel your breasts for lumps and changes. It is best to do this self-exam while lying down. Follow these steps to feel each breast: Place a pillow under the shoulder of one side of your body. Place the arm of that side of your body behind your head. Feel the breast of that side of your body using the hand of the opposite arm. To do this: Start in the nipple area and use the pads of your three middle fingers to make -inch  (2 cm) overlapping circles. Use light, medium, and then firm pressure as you feel your breast, gently covering the entire breast area and armpit. Continue the overlapping circles, moving downward over the breast until you feel your ribs below your breast. Then, make circles with your fingers going upward until you reach your collarbone. Next, make circles by moving outward across your breast and into your armpit area. Squeeze the nipple. Check for discharge and lumps. Repeat steps 1-7 to check your other breast. Sit or stand in the tub or shower. With soapy water on your skin, feel each breast the same way you did when you were lying down. Write down what you find Writing down what you find can help you remember what to discuss with your health care provider. Write down: What is normal for each breast. Any changes that you find in each breast. These include: The kind of changes you find. Any pain or tenderness. Size and location of any lumps. Where you are in your menstrual cycle, if you are still getting your menstrual period (menstruating). General tips If you are breastfeeding, the best time to examine your breasts is after a feeding or after using a breast pump. If you menstruate, the best time to examine your breasts is 5-7 days after your menstrual period. Breasts are generally lumpier during menstrual periods, and it may be more difficult to notice changes. With time and practice, you will become more familiar with the differences in your breasts and more comfortable with the exam. Contact a health care  provider if: You see a change in the shape or size of your breasts or nipples. You see a change in the skin of your breast or nipples, such as a reddened or scaly area. You have unusual discharge from your nipples. You find a new lump or thick area. You have breast pain. You have any concerns about your breast health. Summary Breast self-awareness includes looking for physical  changes in your breasts and feeling for any changes within your breasts. Breast self-awareness should be done in front of a mirror in a well-lit room. If you menstruate, the best time to examine your breasts is 5-7 days after your menstrual period. Tell your health care provider about any changes you notice in your breasts. Changes include changes in size, changes on the skin, pain or tenderness, or unusual fluid from your nipples. This information is not intended to replace advice given to you by your health care provider. Make sure you discuss any questions you have with your health care provider. Document Revised: 03/04/2022 Document Reviewed: 07/30/2021 Elsevier Patient Education  2024 [employer].
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
## 2023-12-06 ENCOUNTER — Inpatient Hospital Stay: Payer: BC Managed Care – PPO | Attending: Oncology | Admitting: Oncology

## 2023-12-06 DIAGNOSIS — Z170 Estrogen receptor positive status [ER+]: Secondary | ICD-10-CM | POA: Diagnosis not present

## 2023-12-06 DIAGNOSIS — C50211 Malignant neoplasm of upper-inner quadrant of right female breast: Secondary | ICD-10-CM | POA: Diagnosis not present
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
## 2024-08-29 ENCOUNTER — Telehealth: Payer: Self-pay | Admitting: Oncology

## 2024-08-29 ENCOUNTER — Other Ambulatory Visit: Payer: Self-pay | Admitting: Oncology

## 2024-08-29 ENCOUNTER — Other Ambulatory Visit: Payer: Self-pay | Admitting: *Deleted

## 2024-08-29 DIAGNOSIS — Z170 Estrogen receptor positive status [ER+]: Secondary | ICD-10-CM

## 2024-08-29 DIAGNOSIS — Z1231 Encounter for screening mammogram for malignant neoplasm of breast: Secondary | ICD-10-CM

## 2024-08-29 NOTE — Telephone Encounter (Signed)
 Patient has scheduled mammo- wrap up noted needs bone density no order for bone density. She would like to get bone density scheduled on the same day day as mammo due to drive- she live near [place].   Please enter order for bone density and schedule.  She would like on the same day as mammo even if day has to be changed.   Can you call her when bone density is scheduled?

## 2024-09-05 ENCOUNTER — Telehealth: Payer: BC Managed Care – PPO | Admitting: Oncology

## 2024-10-03 ENCOUNTER — Other Ambulatory Visit: Payer: Self-pay | Admitting: Oncology

## 2024-10-03 DIAGNOSIS — C50211 Malignant neoplasm of upper-inner quadrant of right female breast: Secondary | ICD-10-CM

## 2024-10-05 ENCOUNTER — Encounter

## 2024-10-09 ENCOUNTER — Ambulatory Visit
Admission: RE | Admit: 2024-10-09 | Discharge: 2024-10-09 | Disposition: A | Discharge status: Home / Self Care | Source: Ambulatory Visit | Attending: Oncology | Admitting: Oncology

## 2024-10-09 DIAGNOSIS — C50211 Malignant neoplasm of upper-inner quadrant of right female breast: Secondary | ICD-10-CM | POA: Diagnosis present

## 2024-10-09 DIAGNOSIS — Z1231 Encounter for screening mammogram for malignant neoplasm of breast: Secondary | ICD-10-CM | POA: Diagnosis present

## 2024-10-09 DIAGNOSIS — Z170 Estrogen receptor positive status [ER+]: Secondary | ICD-10-CM | POA: Diagnosis present

## 2024-10-12 ENCOUNTER — Inpatient Hospital Stay: Attending: Oncology | Admitting: Oncology

## 2024-10-12 DIAGNOSIS — C50211 Malignant neoplasm of upper-inner quadrant of right female breast: Secondary | ICD-10-CM

## 2024-10-12 DIAGNOSIS — Z170 Estrogen receptor positive status [ER+]: Secondary | ICD-10-CM

## 2024-10-12 DIAGNOSIS — M810 Age-related osteoporosis without current pathological fracture: Secondary | ICD-10-CM | POA: Diagnosis not present
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
## 2024-10-19 ENCOUNTER — Ambulatory Visit: Admitting: Surgery

## 2024-10-19 ENCOUNTER — Encounter: Payer: Self-pay | Admitting: Surgery

## 2024-10-19 VITALS — BP 98/62 | HR 91 | Ht 62.0 in | Wt 158.0 lb

## 2024-10-19 DIAGNOSIS — Z853 Personal history of malignant neoplasm of breast: Secondary | ICD-10-CM | POA: Diagnosis not present

## 2024-10-19 DIAGNOSIS — C50211 Malignant neoplasm of upper-inner quadrant of right female breast: Secondary | ICD-10-CM
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
## 2024-10-19 NOTE — Patient Instructions (Addendum)
 You may call us  if you need to see us  in the future.  Annual screening mammograms in 1 year. Can be ordered by Dr Jacobo or your PCP.   Follow-up with our office as needed.  Please call and ask to speak with a nurse if you develop questions or concerns.   How to Do a Breast Self-Exam Doing breast self-exams can help you stay healthy. They're one way to know what's normal for your breasts. They can help you catch a problem while it's still small and can be treated. You need to: Check your breasts often. Tell your doctor about any changes. You should do breast self-exams even if you have breast implants. What you need: A mirror. A well-lit room. A pillow or other soft object. How to do a breast self-exam Look for changes  Take off all the clothes above your waist. Stand in front of a mirror in a room with good lighting. Put your hands down at your sides. Compare your breasts in the mirror. Look for difference between them, such as: Differences in shape. Differences in size. Wrinkles, dips, and bumps in one breast and not the other. Look at each breast for skin changes, such as: Redness. Scaly spots. Spots where your skin is thicker. Dimpling. Open sores. Look for changes in your nipples, such as: Fluid coming out of a nipple. Fluid around a nipple. Bleeding. Dimpling. Redness. A nipple that looks pushed in or that has changed position. Feel for changes Lie on your back. Feel each breast. To do this: Pick a breast to feel. Place a pillow under the shoulder closest to that breast. Put the arm closest to that breast behind your head. Feel the breast using the hand of your other arm. Use the pads of your three middle fingers to make small circles starting near the nipple. Use light, medium, and firm pressure. Keep making circles, moving down over the breast. Stop when you feel your ribs. Start making circles with your fingers again, this time going up until you reach  your collarbone. Then, make circles out across your breast and into your armpit area. Squeeze your nipple. Check for fluid and lumps. Do these steps again to check your other breast. Sit or stand in the tub or shower. With soapy water  on your skin, feel each breast the same way you did when you were lying down. Write down what you find Writing down what you find can help you keep track of what you want to tell your doctor. Write down: What's normal for each breast. Any changes you find. Write down: The kind of change. If your breast feels tender or painful. Any lump you find. Write down its size and where it is. When you last had your period. General tips If you're breastfeeding, the best time to check your breasts is after you feed your baby or after you use a breast pump. If you get a period, the best time to check your breasts is 5-7 days after your period ends. With time, you'll get more used to doing the self-exam. You'll also start to know if there are changes in your breasts. Contact a doctor if: You see a change in the shape or size of your breasts or nipples. You see a change in the skin of your breast or nipples. You have fluid coming from your nipples that isn't normal. You find a new lump or thick area. You have breast pain. You have any concerns about your breast health.  This information is not intended to replace advice given to you by your health care provider. Make sure you discuss any questions you have with your health care provider. Document Revised: 12/07/2023 Document Reviewed: 12/07/2023 Elsevier Patient Education  2025 [employer].

## 2025-10-01 ENCOUNTER — Inpatient Hospital Stay: Admitting: Oncology
